# Patient Record
Sex: Male | Born: 2000 | Race: White | Hispanic: No | Marital: Single | Smoking: Never smoker
Health system: Southern US, Community
[De-identification: ages and names within clinical notes are randomized; demographics above are authoritative.]

## PROBLEM LIST (undated history)

## (undated) DIAGNOSIS — M1990 Unspecified osteoarthritis, unspecified site: Secondary | ICD-10-CM

## (undated) DIAGNOSIS — M249 Joint derangement, unspecified: Secondary | ICD-10-CM

---
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
## 2021-06-10 ENCOUNTER — Emergency Department (HOSPITAL_COMMUNITY): Payer: BC Managed Care – PPO

## 2021-06-10 ENCOUNTER — Other Ambulatory Visit: Payer: Self-pay

## 2021-06-10 ENCOUNTER — Emergency Department (HOSPITAL_COMMUNITY)
Admission: EM | Admit: 2021-06-10 | Discharge: 2021-06-10 | Disposition: A | Discharge status: Home / Self Care | Payer: BC Managed Care – PPO | Attending: Emergency Medicine | Admitting: Emergency Medicine

## 2021-06-10 ENCOUNTER — Encounter (HOSPITAL_COMMUNITY): Payer: Self-pay

## 2021-06-10 DIAGNOSIS — J982 Interstitial emphysema: Secondary | ICD-10-CM | POA: Diagnosis not present

## 2021-06-10 DIAGNOSIS — R0789 Other chest pain: Secondary | ICD-10-CM | POA: Diagnosis present

## 2021-06-10 LAB — CBC WITH DIFFERENTIAL/PLATELET
Abs Immature Granulocytes: 0.01 10*3/uL (ref 0.00–0.07)
Basophils Absolute: 0 10*3/uL (ref 0.0–0.1)
Basophils Relative: 0 %
Eosinophils Absolute: 0.1 10*3/uL (ref 0.0–0.5)
Eosinophils Relative: 1 %
HCT: 43.9 % (ref 39.0–52.0)
Hemoglobin: 14.9 g/dL (ref 13.0–17.0)
Immature Granulocytes: 0 %
Lymphocytes Relative: 31 %
Lymphs Abs: 2.6 10*3/uL (ref 0.7–4.0)
MCH: 30.4 pg (ref 26.0–34.0)
MCHC: 33.9 g/dL (ref 30.0–36.0)
MCV: 89.6 fL (ref 80.0–100.0)
Monocytes Absolute: 0.9 10*3/uL (ref 0.1–1.0)
Monocytes Relative: 11 %
Neutro Abs: 4.8 10*3/uL (ref 1.7–7.7)
Neutrophils Relative %: 57 %
Platelets: 235 10*3/uL (ref 150–400)
RBC: 4.9 MIL/uL (ref 4.22–5.81)
RDW: 13.3 % (ref 11.5–15.5)
WBC: 8.4 10*3/uL (ref 4.0–10.5)
nRBC: 0 % (ref 0.0–0.2)

## 2021-06-10 LAB — BASIC METABOLIC PANEL
Anion gap: 5 (ref 5–15)
BUN: 18 mg/dL (ref 6–20)
CO2: 24 mmol/L (ref 22–32)
Calcium: 9.3 mg/dL (ref 8.9–10.3)
Chloride: 106 mmol/L (ref 98–111)
Creatinine, Ser: 0.9 mg/dL (ref 0.61–1.24)
GFR, Estimated: >60 mL/min (ref >60)
Glucose, Bld: 95 mg/dL (ref 70–99)
Potassium: 3.7 mmol/L (ref 3.5–5.1)
Sodium: 135 mmol/L (ref 135–145)

## 2021-06-10 LAB — TROPONIN I (HIGH SENSITIVITY): Troponin I (High Sensitivity): <2 ng/L (ref <18)

## 2021-06-10 MED ORDER — IOHEXOL 350 MG/ML SOLN
100.0000 mL | Freq: Once | INTRAVENOUS | Status: AC | PRN
  Administered 2021-06-10: 100 mL via INTRAVENOUS

## 2021-06-10 NOTE — ED Notes (Signed)
Called lab to process add-on troponin.

## 2021-06-10 NOTE — ED Notes (Signed)
Pt in bed, sig other at bedside, pt states that he was sent from urgent care for some air in his chest, pt has slight crepitus like feeling on palpation of L upper chest mid clavicular line.  PT reports pain to ribs with palpation,  pt on cardiac and O2 sat monitor, skin is warm and dry, resps even and unlabored, pt states he doesn't need anything for pain at this time.
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
## 2021-06-10 NOTE — ED Notes (Signed)
Blue top sent down as save if needed.

## 2021-06-10 NOTE — Discharge Instructions (Addendum)
Please carefully review the attached instructions, paying attention to return precautions to the ER.

## 2021-06-10 NOTE — ED Notes (Signed)
Pt in bed, pt states that his pain is a 5/10, states that he doesn't need anything for pain, pt states that he is ready to go home, resps even and unlabored, pt from dpt with sig other.
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
## 2021-06-23 ENCOUNTER — Ambulatory Visit
Admission: RE | Admit: 2021-06-23 | Discharge: 2021-06-23 | Disposition: A | Discharge status: Home / Self Care | Payer: BC Managed Care – PPO | Source: Ambulatory Visit | Attending: Physician Assistant | Admitting: Physician Assistant

## 2021-06-23 ENCOUNTER — Other Ambulatory Visit: Payer: Self-pay

## 2021-06-23 ENCOUNTER — Institutional Professional Consult (permissible substitution) (INDEPENDENT_AMBULATORY_CARE_PROVIDER_SITE_OTHER): Payer: BC Managed Care – PPO | Admitting: Physician Assistant

## 2021-06-23 VITALS — BP 144/89 | HR 97 | Resp 20 | Ht 72.0 in | Wt 135.0 lb

## 2021-06-23 DIAGNOSIS — J982 Interstitial emphysema: Secondary | ICD-10-CM | POA: Diagnosis not present
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
## 2021-06-23 NOTE — Patient Instructions (Addendum)
We recommend no heavy lifting or heavy exercise, no scuba diving, no flying and avoid tobacco products (vaping) for a minimum of 4 weeks from the date of injury.

## 2021-07-22 ENCOUNTER — Ambulatory Visit
Admission: RE | Admit: 2021-07-22 | Discharge: 2021-07-22 | Disposition: A | Discharge status: Home / Self Care | Payer: BC Managed Care – PPO | Source: Ambulatory Visit | Attending: Physician Assistant | Admitting: Physician Assistant

## 2021-07-22 ENCOUNTER — Telehealth: Payer: Self-pay | Admitting: *Deleted

## 2021-07-22 ENCOUNTER — Other Ambulatory Visit: Payer: Self-pay | Admitting: *Deleted

## 2021-07-22 DIAGNOSIS — J982 Interstitial emphysema: Secondary | ICD-10-CM

## 2021-07-22 NOTE — Telephone Encounter (Signed)
Patient contacted the office with concerns of another pneumomediastinum. Per patient, he was lifting approximately 20lb equipment at work consistently for about 30 minutes. Per patient felt a heaviness in his chest afterwards similar to what he felt when he had a pneumomediastinum. He states he feels slightly out of breath. Chest xray ordered and reviewed with Gaynelle Arabian, PA. Patient aware of results. Advised patient to follow up with PCP for further symptom management.

## 2022-01-30 ENCOUNTER — Ambulatory Visit
Admission: EM | Admit: 2022-01-30 | Discharge: 2022-01-30 | Disposition: A | Discharge status: Home / Self Care | Payer: BC Managed Care – PPO

## 2022-01-30 DIAGNOSIS — S76111A Strain of right quadriceps muscle, fascia and tendon, initial encounter: Secondary | ICD-10-CM | POA: Diagnosis not present
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
## 2022-01-30 NOTE — Discharge Instructions (Addendum)
I enclosed some information about quadricep strain and self-care at home.  I provided you with a note for this weekend.  Please follow-up with your regular provider if you are not seeing meaningful improvement in the next 7 to 10 days.

## 2022-02-09 ENCOUNTER — Ambulatory Visit: Payer: BC Managed Care – PPO

## 2022-02-09 ENCOUNTER — Ambulatory Visit
Admission: EM | Admit: 2022-02-09 | Discharge: 2022-02-09 | Disposition: A | Discharge status: Home / Self Care | Payer: BC Managed Care – PPO

## 2022-02-09 NOTE — ED Triage Notes (Signed)
Triaged and discharged by provider

## 2022-04-25 ENCOUNTER — Ambulatory Visit
Admission: EM | Admit: 2022-04-25 | Discharge: 2022-04-25 | Disposition: A | Discharge status: Home / Self Care | Payer: BC Managed Care – PPO

## 2022-04-25 DIAGNOSIS — R112 Nausea with vomiting, unspecified: Secondary | ICD-10-CM | POA: Diagnosis not present

## 2022-04-25 DIAGNOSIS — K529 Noninfective gastroenteritis and colitis, unspecified: Secondary | ICD-10-CM | POA: Diagnosis not present

## 2022-04-25 MED ORDER — ONDANSETRON 4 MG PO TBDP: 4.0000 mg | ORAL_TABLET | Freq: Three times a day (TID) | ORAL | 0 refills | Status: DC | PRN

## 2022-04-25 MED ORDER — ONDANSETRON 4 MG PO TBDP
4.0000 mg | ORAL_TABLET | Freq: Once | ORAL | Status: AC
  Administered 2022-04-25: 4 mg via ORAL

## 2022-04-25 NOTE — ED Triage Notes (Signed)
Pt reports nausea and fever x 1 day. Tylenol gives some relief.   Negative COVID last night.

## 2022-04-25 NOTE — ED Provider Notes (Signed)
[facility]    CSN: [id number] Arrival date & time: 04/25/22  7106    HISTORY   Chief Complaint  Patient presents with   Nausea    Entered by patient   Fever   HPI Anthony Montgomery is a pleasant, 21 y.o. male who presents to urgent care today. Patient reports fever of 100 last night and fever of 99.9 this morning.  Patient states he has had nausea and vomiting since yesterday evening.  Patient states he supposed to work tonight but does not feel well enough to go, is requesting a note for work.  Patient denies illicit drug abuse, recently eating foods that may be contaminated, known sick contacts.  Patient denies sore throat, headache, cough, rhinorrhea, nasal congestion, otalgia, body aches, chills, diarrhea, constipation.  Patient states he took Tylenol for his fever this morning, did not vomit after doing so.  Patient states he is tolerating p.o. liquids well.  Patient has normal vital signs on arrival today.  The history is provided by the patient.   Past Medical History:  Diagnosis Date   Arthritis    Hypermobile joints    There are no problems to display for this patient.  History reviewed. No pertinent surgical history.  Home Medications    Prior to Admission medications   Medication Sig Start Date End Date Taking? Authorizing Provider  acetaminophen (TYLENOL) 500 MG tablet Take 500 mg by mouth every 6 (six) hours as needed.   Yes [provider]  esomeprazole (NEXIUM) 20 MG capsule esomeprazole 20 mg oral delayed release capsule Start Date: 07/16/20 Status: Ordered 07/16/20  Yes [provider]  ondansetron (ZOFRAN-ODT) 4 MG disintegrating tablet Take 1 tablet (4 mg total) by mouth every 8 (eight) hours as needed for nausea or vomiting. 04/25/22  Yes Theadora Rama Scales, PA-C  sertraline (ZOLOFT) 100 MG tablet Take 100 mg by mouth daily.    [provider]    Family History Family History  Problem Relation Age of Onset    Healthy Mother    Healthy Father    Social History Social History   Tobacco Use   Smoking status: Never   Smokeless tobacco: Never  Vaping Use   Vaping Use: Never used  Substance Use Topics   Alcohol use: Yes    Comment: ocassionally   Drug use: Never   Allergies   Patient has no known allergies.  Review of Systems Review of Systems Pertinent findings revealed after performing a 14 point review of systems has been noted in the history of present illness.  Physical Exam Triage Vital Signs ED Triage Vitals  Enc Vitals Group     BP 03/21/21 0827 (!) 147/82     Pulse Rate 03/21/21 0827 72     Resp 03/21/21 0827 18     Temp 03/21/21 0827 98.3 F (36.8 C)     Temp Source 03/21/21 0827 Oral     SpO2 03/21/21 0827 98 %     Weight --      Height --      Head Circumference --      Peak Flow --      Pain Score 03/21/21 0826 5     Pain Loc --      Pain Edu? --      Excl. in GC? --   No data found.  Updated Vital Signs BP 127/71 (BP Location: Right Arm)   Pulse (!) 54   Temp 97.6 F (36.4 C) (Oral)  Resp 17   SpO2 98%   Physical Exam Vitals and nursing note reviewed.  Constitutional:      General: He is not in acute distress.    Appearance: Normal appearance. He is normal weight. He is not ill-appearing.  HENT:     Head: Normocephalic and atraumatic.  Eyes:     Extraocular Movements: Extraocular movements intact.     Conjunctiva/sclera: Conjunctivae normal.     Pupils: Pupils are equal, round, and reactive to light.  Cardiovascular:     Rate and Rhythm: Normal rate and regular rhythm.  Pulmonary:     Effort: Pulmonary effort is normal.     Breath sounds: Normal breath sounds.  Abdominal:     General: Abdomen is flat. Bowel sounds are normal.     Palpations: Abdomen is soft.  Musculoskeletal:        General: Normal range of motion.     Cervical back: Normal range of motion and neck supple.  Skin:    General: Skin is warm and dry.  Neurological:      General: No focal deficit present.     Mental Status: He is alert and oriented to person, place, and time. Mental status is at baseline.  Psychiatric:        Mood and Affect: Mood normal.        Behavior: Behavior normal.        Thought Content: Thought content normal.        Judgment: Judgment normal.     Visual Acuity Right Eye Distance:   Left Eye Distance:   Bilateral Distance:    Right Eye Near:   Left Eye Near:    Bilateral Near:     UC Couse / Diagnostics / Procedures:     Radiology No results found.  Procedures Procedures (including critical care time) EKG  Pending results:  Labs Reviewed - No data to display  Medications Ordered in UC: Medications  ondansetron (ZOFRAN-ODT) disintegrating tablet 4 mg (has no administration in time range)    UC Diagnoses / Final Clinical Impressions(s)   I have reviewed the triage vital signs and the nursing notes.  Pertinent labs & imaging results that were available during my care of the patient were reviewed by me and considered in my medical decision making (see chart for details).    Final diagnoses:  Nausea and vomiting, unspecified vomiting type  Gastroenteritis   Patient provided with a note to be out of work along with a prescription for Zofran.  Zofran provided during his visit today as well.  Return precautions advised.  ED Prescriptions     Medication Sig Dispense Auth. Provider   ondansetron (ZOFRAN-ODT) 4 MG disintegrating tablet Take 1 tablet (4 mg total) by mouth every 8 (eight) hours as needed for nausea or vomiting. 20 tablet Theadora Rama Scales, PA-C      PDMP not reviewed this encounter.  Pending results:  Labs Reviewed - No data to display  Discharge Instructions:   Discharge Instructions      I provided you with a note to return to work.  I sent a prescription for Zofran to your pharmacy, this is a same medication that you received during your visit today.  This medication reduces  nausea symptoms therefore resolves vomiting.  I provided you with some information about nausea and vomiting and self-care at home.  Please let us know if your symptoms do not resolve after 48 to 72 hours.  Thank you for visiting urgent  care today.    Disposition Upon Discharge:  Condition: stable for discharge home  Patient presented with an acute illness with associated systemic symptoms and significant discomfort requiring urgent management. In my opinion, this is a condition that a prudent lay person (someone who possesses an average knowledge of health and medicine) may potentially expect to result in complications if not addressed urgently such as respiratory distress, impairment of bodily function or dysfunction of bodily organs.   Routine symptom specific, illness specific and/or disease specific instructions were discussed with the patient and/or caregiver at length.   As such, the patient has been evaluated and assessed, work-up was performed and treatment was provided in alignment with urgent care protocols and evidence based medicine.  Patient/parent/caregiver has been advised that the patient may require follow up for further testing and treatment if the symptoms continue in spite of treatment, as clinically indicated and appropriate.  Patient/parent/caregiver has been advised to return to the [facility] or PCP if no better; to PCP or the Emergency Department if new signs and symptoms develop, or if the current signs or symptoms continue to change or worsen for further workup, evaluation and treatment as clinically indicated and appropriate  The patient will follow up with their current PCP if and as advised. If the patient does not currently have a PCP we will assist them in obtaining one.   The patient may need specialty follow up if the symptoms continue, in spite of conservative treatment and management, for further workup, evaluation, consultation and treatment as clinically indicated  and appropriate.   Patient/parent/caregiver verbalized understanding and agreement of plan as discussed.  All questions were addressed during visit.  Please see discharge instructions below for further details of plan.  This office note has been dictated using Teaching laboratory technician.  Unfortunately, this method of dictation can sometimes lead to typographical or grammatical errors.  I apologize for your inconvenience in advance if this occurs.  Please do not hesitate to reach out to me if clarification is needed.      Theadora Rama Scales, PA-C 04/25/22 1145

## 2022-04-25 NOTE — Discharge Instructions (Signed)
I provided you with a note to return to work.  I sent a prescription for Zofran to your pharmacy, this is a same medication that you received during your visit today.  This medication reduces nausea symptoms therefore resolves vomiting.  I provided you with some information about nausea and vomiting and self-care at home.  Please let us know if your symptoms do not resolve after 48 to 72 hours.  Thank you for visiting urgent care today.

## 2022-09-27 ENCOUNTER — Ambulatory Visit (INDEPENDENT_AMBULATORY_CARE_PROVIDER_SITE_OTHER): Payer: BC Managed Care – PPO

## 2022-09-27 ENCOUNTER — Encounter (HOSPITAL_COMMUNITY): Payer: Self-pay

## 2022-09-27 ENCOUNTER — Ambulatory Visit (HOSPITAL_COMMUNITY)
Admission: EM | Admit: 2022-09-27 | Discharge: 2022-09-27 | Disposition: A | Discharge status: Home / Self Care | Payer: BC Managed Care – PPO | Attending: Family Medicine | Admitting: Family Medicine

## 2022-09-27 DIAGNOSIS — M25511 Pain in right shoulder: Secondary | ICD-10-CM

## 2022-09-27 MED ORDER — IBUPROFEN 800 MG PO TABS: 800.0000 mg | ORAL_TABLET | Freq: Three times a day (TID) | ORAL | 0 refills | Status: DC | PRN
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
## 2022-09-27 NOTE — Discharge Instructions (Signed)
Here for your x-rays do not show any dislocation at this time.  There is also no broken bone.  I do think you have some loosening of your muscles around your shoulder.  I think it would be helpful to go see orthopedics about this   Take ibuprofen 800 mg--1 tab every 8 hours as needed for pain.

## 2022-11-10 IMAGING — CR DG CHEST 2V
2 series · 2 of 2 positions shown · non-contrast
Comparison: 06/23/2021

CLINICAL DATA: History of pneumomediastinum.

EXAM:
CHEST - 2 VIEW

[w chest pa]
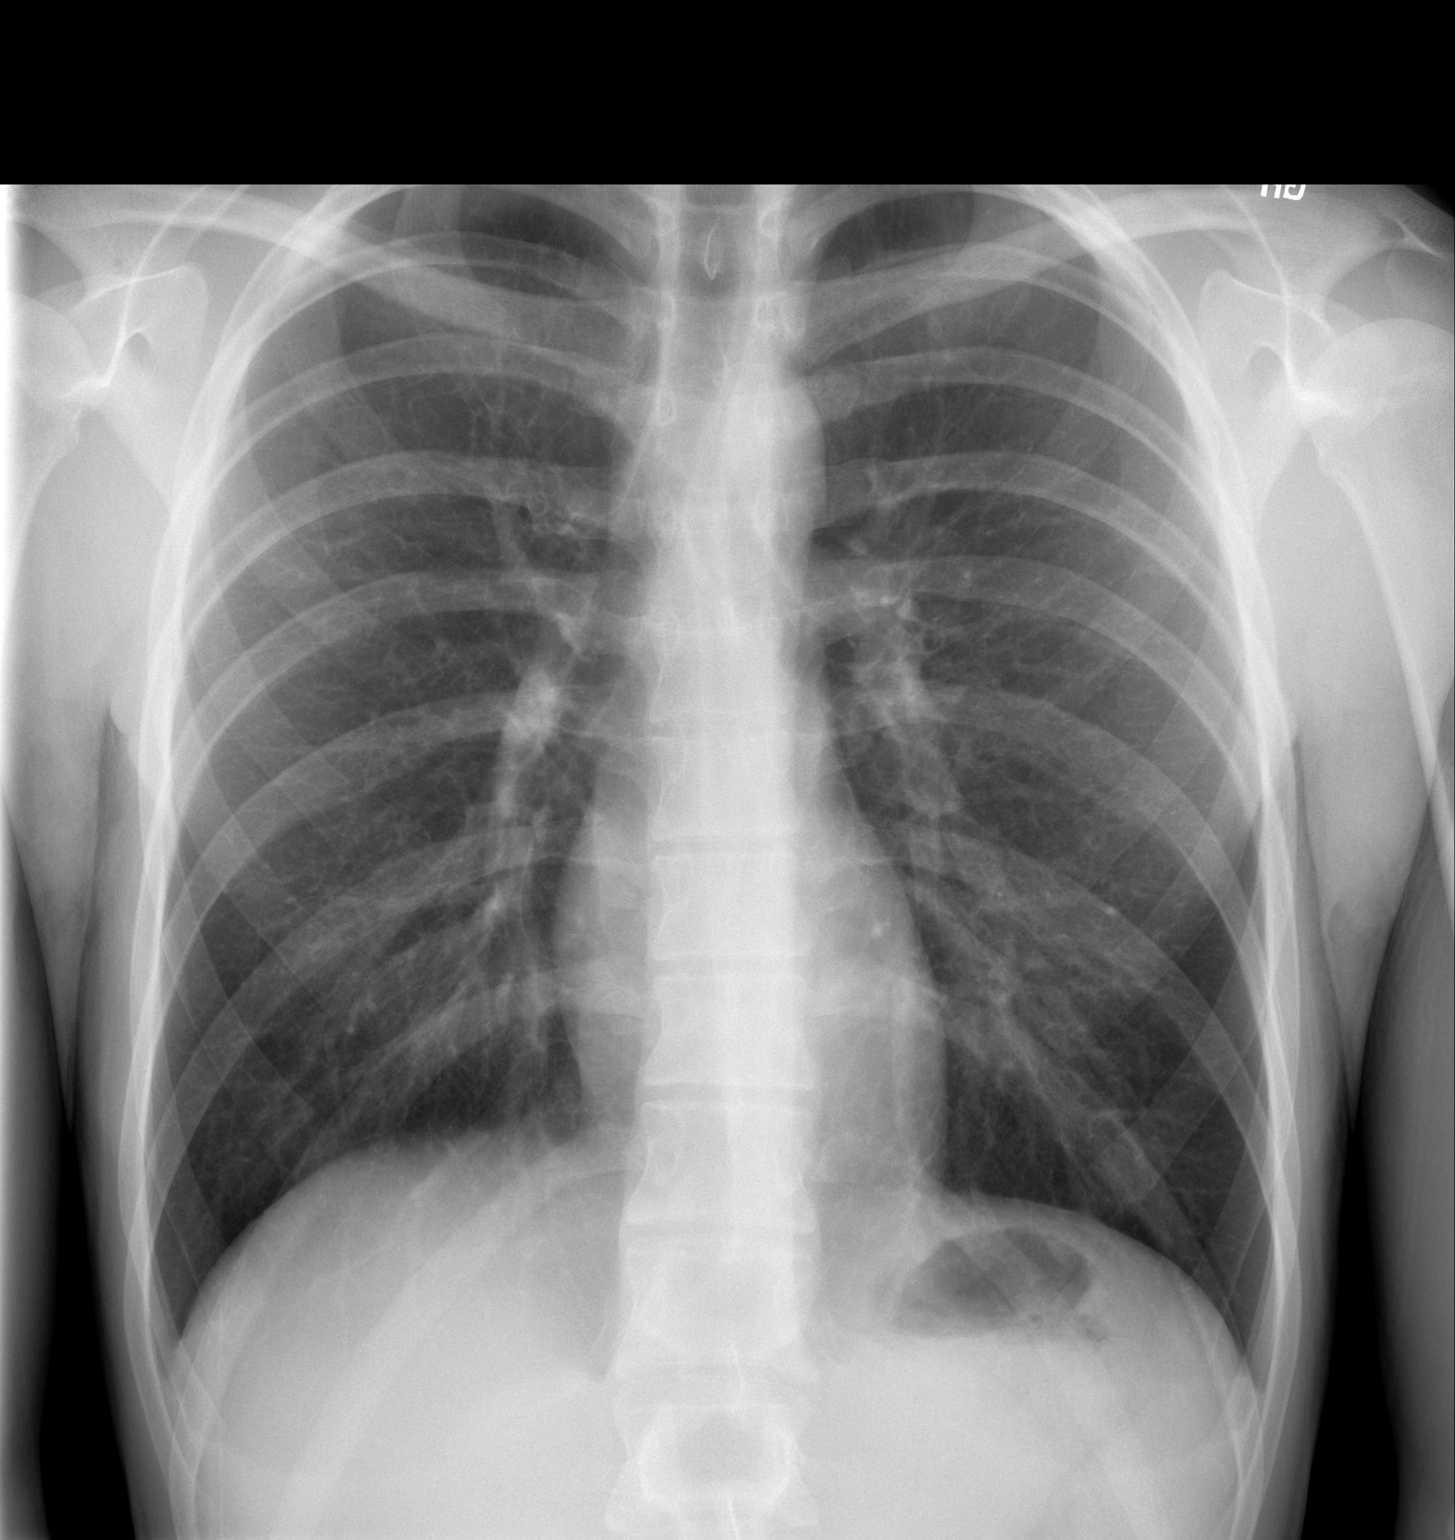

[w chest lat]
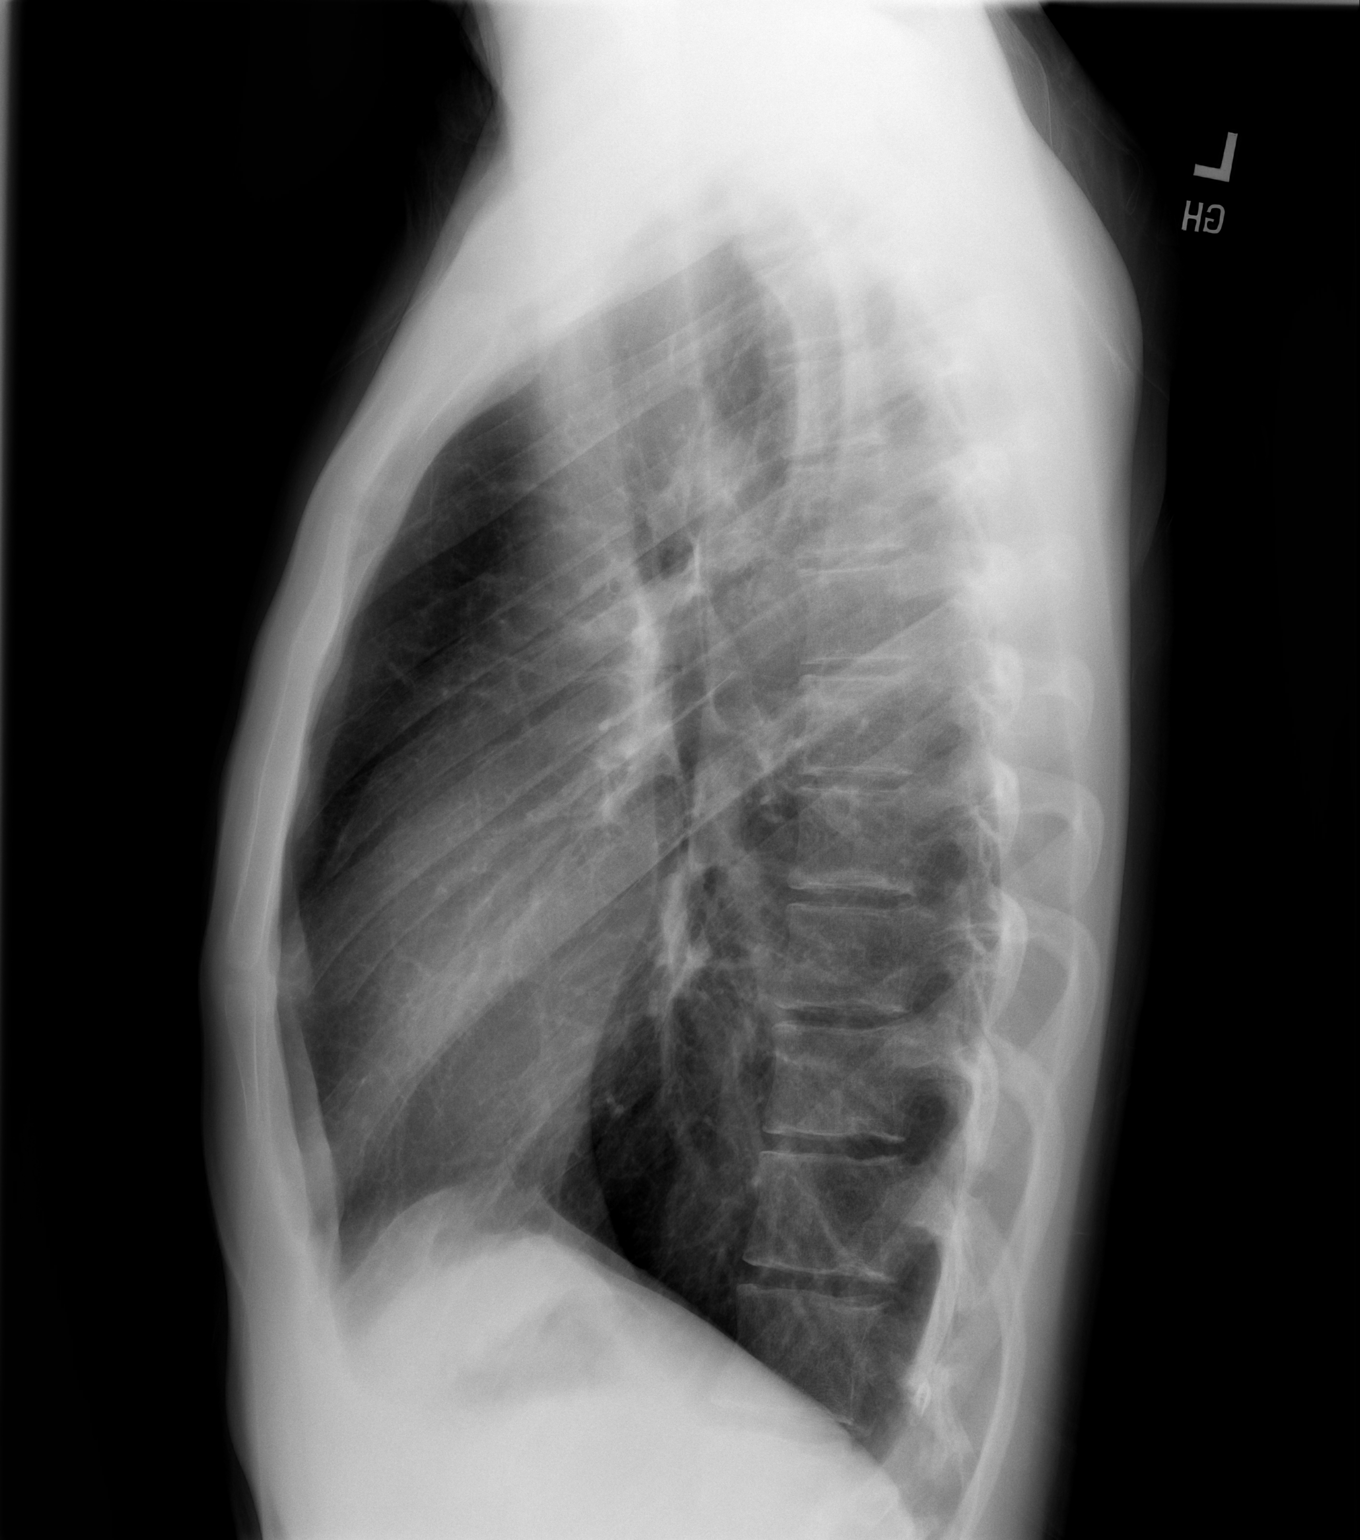

[2 of 2 positions shown; findings below may reference images not displayed]

FINDINGS: The heart size and mediastinal contours are within normal limits.
Both lungs are clear. The visualized skeletal structures are
unremarkable.
IMPRESSION: No active cardiopulmonary disease.

## 2023-03-06 ENCOUNTER — Emergency Department (HOSPITAL_COMMUNITY)
Admission: EM | Admit: 2023-03-06 | Discharge: 2023-03-06 | Disposition: A | Discharge status: Home / Self Care | Payer: BC Managed Care – PPO | Attending: Emergency Medicine | Admitting: Emergency Medicine

## 2023-03-06 ENCOUNTER — Emergency Department (HOSPITAL_COMMUNITY): Payer: BC Managed Care – PPO

## 2023-03-06 DIAGNOSIS — Y92410 Unspecified street and highway as the place of occurrence of the external cause: Secondary | ICD-10-CM | POA: Diagnosis not present

## 2023-03-06 DIAGNOSIS — S62396A Other fracture of fifth metacarpal bone, right hand, initial encounter for closed fracture: Secondary | ICD-10-CM | POA: Diagnosis not present

## 2023-03-06 DIAGNOSIS — S0993XA Unspecified injury of face, initial encounter: Secondary | ICD-10-CM | POA: Diagnosis present

## 2023-03-06 DIAGNOSIS — S42002A Fracture of unspecified part of left clavicle, initial encounter for closed fracture: Secondary | ICD-10-CM | POA: Diagnosis not present

## 2023-03-06 DIAGNOSIS — S81811A Laceration without foreign body, right lower leg, initial encounter: Secondary | ICD-10-CM | POA: Insufficient documentation

## 2023-03-06 DIAGNOSIS — Z23 Encounter for immunization: Secondary | ICD-10-CM | POA: Diagnosis not present

## 2023-03-06 DIAGNOSIS — S022XXA Fracture of nasal bones, initial encounter for closed fracture: Secondary | ICD-10-CM | POA: Insufficient documentation

## 2023-03-06 LAB — COMPREHENSIVE METABOLIC PANEL
ALT: 23 U/L (ref 0–44)
AST: 45 U/L — ABNORMAL HIGH (ref 15–41)
Albumin: 4.1 g/dL (ref 3.5–5.0)
Alkaline Phosphatase: 72 U/L (ref 38–126)
Anion gap: 17 — ABNORMAL HIGH (ref 5–15)
BUN: 17 mg/dL (ref 6–20)
CO2: 19 mmol/L — ABNORMAL LOW (ref 22–32)
Calcium: 9.6 mg/dL (ref 8.9–10.3)
Chloride: 102 mmol/L (ref 98–111)
Creatinine, Ser: 1.13 mg/dL (ref 0.61–1.24)
GFR, Estimated: >60 mL/min (ref >60)
Glucose, Bld: 144 mg/dL — ABNORMAL HIGH (ref 70–99)
Potassium: 3.7 mmol/L (ref 3.5–5.1)
Sodium: 138 mmol/L (ref 135–145)
Total Bilirubin: 1.2 mg/dL (ref 0.3–1.2)
Total Protein: 7.4 g/dL (ref 6.5–8.1)

## 2023-03-06 LAB — CBC
HCT: 41.6 % (ref 39.0–52.0)
Hemoglobin: 14.3 g/dL (ref 13.0–17.0)
MCH: 29.2 pg (ref 26.0–34.0)
MCHC: 34.4 g/dL (ref 30.0–36.0)
MCV: 84.9 fL (ref 80.0–100.0)
Platelets: 340 10*3/uL (ref 150–400)
RBC: 4.9 MIL/uL (ref 4.22–5.81)
RDW: 12.8 % (ref 11.5–15.5)
WBC: 16.6 10*3/uL — ABNORMAL HIGH (ref 4.0–10.5)
nRBC: 0 % (ref 0.0–0.2)

## 2023-03-06 LAB — URINALYSIS, ROUTINE W REFLEX MICROSCOPIC
Bacteria, UA: NONE SEEN
Bilirubin Urine: NEGATIVE
Glucose, UA: NEGATIVE mg/dL
Ketones, ur: 20 mg/dL — AB
Leukocytes,Ua: NEGATIVE
Nitrite: NEGATIVE
Protein, ur: 30 mg/dL — AB
Specific Gravity, Urine: >1.046 — ABNORMAL HIGH (ref 1.005–1.030)
pH: 6 (ref 5.0–8.0)

## 2023-03-06 LAB — SAMPLE TO BLOOD BANK

## 2023-03-06 LAB — I-STAT CHEM 8, ED
BUN: 18 mg/dL (ref 6–20)
Calcium, Ion: 1.15 mmol/L (ref 1.15–1.40)
Chloride: 105 mmol/L (ref 98–111)
Creatinine, Ser: 0.9 mg/dL (ref 0.61–1.24)
Glucose, Bld: 143 mg/dL — ABNORMAL HIGH (ref 70–99)
HCT: 44 % (ref 39.0–52.0)
Hemoglobin: 15 g/dL (ref 13.0–17.0)
Potassium: 3.3 mmol/L — ABNORMAL LOW (ref 3.5–5.1)
Sodium: 138 mmol/L (ref 135–145)
TCO2: 20 mmol/L — ABNORMAL LOW (ref 22–32)

## 2023-03-06 LAB — I-STAT CG4 LACTIC ACID, ED: Lactic Acid, Venous: 3.2 mmol/L (ref 0.5–1.9)

## 2023-03-06 LAB — PROTIME-INR
INR: 1.1 (ref 0.8–1.2)
Prothrombin Time: 14.4 s (ref 11.4–15.2)

## 2023-03-06 LAB — ETHANOL: Alcohol, Ethyl (B): <10 mg/dL (ref <10)

## 2023-03-06 MED ORDER — TETANUS-DIPHTH-ACELL PERTUSSIS 5-2.5-18.5 LF-MCG/0.5 IM SUSY
0.5000 mL | PREFILLED_SYRINGE | Freq: Once | INTRAMUSCULAR | Status: AC
  Administered 2023-03-06: 0.5 mL via INTRAMUSCULAR
  Filled 2023-03-06: qty 0.5

## 2023-03-06 MED ORDER — FENTANYL CITRATE PF 50 MCG/ML IJ SOSY
50.0000 ug | PREFILLED_SYRINGE | Freq: Once | INTRAMUSCULAR | Status: AC
  Administered 2023-03-06: 50 ug via INTRAVENOUS
  Filled 2023-03-06: qty 1

## 2023-03-06 MED ORDER — OXYCODONE-ACETAMINOPHEN 5-325 MG PO TABS
2.0000 | ORAL_TABLET | Freq: Once | ORAL | Status: AC
  Administered 2023-03-06: 2 via ORAL
  Filled 2023-03-06: qty 2

## 2023-03-06 MED ORDER — ONDANSETRON HCL 4 MG/2ML IJ SOLN
4.0000 mg | Freq: Once | INTRAMUSCULAR | Status: AC
  Administered 2023-03-06: 4 mg via INTRAVENOUS
  Filled 2023-03-06: qty 2

## 2023-03-06 MED ORDER — LIDOCAINE-EPINEPHRINE 1 %-1:100000 IJ SOLN
10.0000 mL | Freq: Once | INTRAMUSCULAR | Status: AC
  Administered 2023-03-06: 5 mL
  Filled 2023-03-06: qty 1

## 2023-03-06 MED ORDER — OXYCODONE HCL 5 MG PO TABS
5.0000 mg | ORAL_TABLET | ORAL | 0 refills | Status: AC | PRN
Start: 2023-03-06 — End: ?

## 2023-03-06 MED ORDER — IOHEXOL 350 MG/ML SOLN
75.0000 mL | Freq: Once | INTRAVENOUS | Status: AC | PRN
  Administered 2023-03-06: 75 mL via INTRAVENOUS

## 2023-03-06 NOTE — Progress Notes (Signed)
Orthopedic Tech Progress Note Patient Details:  Anthony Montgomery 2001-05-06 [id number]  Ortho Devices Type of Ortho Device: Sling immobilizer, CAM walker, Ulna gutter splint Ortho Device/Splint Location: lle cam boot, rue ulna gutter, lue sling immobilizer. Ortho Device/Splint Interventions: Ordered, Application, Adjustment  I applied the splint and boot. The rn applied the sling. Post Interventions Patient Tolerated: Well Instructions Provided: Care of device, Adjustment of device  [name] 03/06/2023, 10:21 PM
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
## 2023-03-06 NOTE — ED Notes (Signed)
All of patients wounds cleaned and dressed. Extra supplies sent with patient. Sling applied to left arm after dressing patient.
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
## 2023-03-07 ENCOUNTER — Telehealth (HOSPITAL_COMMUNITY): Payer: Self-pay | Admitting: Emergency Medicine

## 2023-03-07 ENCOUNTER — Telehealth: Payer: Self-pay

## 2023-03-07 MED ORDER — ONDANSETRON HCL 4 MG PO TABS: 4.0000 mg | ORAL_TABLET | Freq: Four times a day (QID) | ORAL | 0 refills | Status: AC
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
## 2023-03-07 NOTE — Telephone Encounter (Signed)
Patient called in about referrals, no information on chart about who he is supposed to follow up with, ambulatory referrals were sent to orthopedic, ENT and hand surgery. Sent patient a message that they will call him when they wick up the referrals.

## 2023-03-09 ENCOUNTER — Other Ambulatory Visit (INDEPENDENT_AMBULATORY_CARE_PROVIDER_SITE_OTHER): Payer: Self-pay

## 2023-03-09 ENCOUNTER — Other Ambulatory Visit (INDEPENDENT_AMBULATORY_CARE_PROVIDER_SITE_OTHER): Payer: BC Managed Care – PPO

## 2023-03-09 ENCOUNTER — Ambulatory Visit: Payer: BC Managed Care – PPO | Admitting: Orthopaedic Surgery

## 2023-03-09 DIAGNOSIS — M79672 Pain in left foot: Secondary | ICD-10-CM | POA: Diagnosis not present

## 2023-03-09 DIAGNOSIS — M79641 Pain in right hand: Secondary | ICD-10-CM

## 2023-03-09 DIAGNOSIS — S4992XA Unspecified injury of left shoulder and upper arm, initial encounter: Secondary | ICD-10-CM | POA: Diagnosis not present

## 2023-03-09 MED ORDER — TRAMADOL HCL 50 MG PO TABS
50.0000 mg | ORAL_TABLET | Freq: Three times a day (TID) | ORAL | 0 refills | Status: AC | PRN
Start: 2023-03-09 — End: ?
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
## 2023-03-10 ENCOUNTER — Ambulatory Visit (INDEPENDENT_AMBULATORY_CARE_PROVIDER_SITE_OTHER): Payer: BC Managed Care – PPO | Admitting: Otolaryngology

## 2023-03-10 ENCOUNTER — Encounter (INDEPENDENT_AMBULATORY_CARE_PROVIDER_SITE_OTHER): Payer: Self-pay

## 2023-03-10 VITALS — Ht 72.0 in | Wt 151.0 lb

## 2023-03-10 DIAGNOSIS — S022XXA Fracture of nasal bones, initial encounter for closed fracture: Secondary | ICD-10-CM

## 2023-03-11 ENCOUNTER — Telehealth: Payer: Self-pay | Admitting: Physician Assistant

## 2023-03-11 NOTE — Telephone Encounter (Signed)
6 months

## 2023-03-11 NOTE — Telephone Encounter (Signed)
Left voicemail ready for pickup

## 2023-03-11 NOTE — Telephone Encounter (Signed)
Pt's girlfriend called requesting a temp handicap placard. Please call Delorise Shiner when ready for pick up at [phone].

## 2023-03-12 DIAGNOSIS — S022XXA Fracture of nasal bones, initial encounter for closed fracture: Secondary | ICD-10-CM | POA: Insufficient documentation
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
## 2023-03-15 ENCOUNTER — Telehealth: Payer: Self-pay | Admitting: Orthopaedic Surgery

## 2023-03-15 NOTE — Telephone Encounter (Signed)
STD forms ,auth,$25 cash received. To [employer]. Please advised how long patient will be oow. Thank you

## 2023-03-15 NOTE — Telephone Encounter (Signed)
Noted for [employer]. 

## 2023-03-15 NOTE — Telephone Encounter (Signed)
6-12 weeks 

## 2023-03-23 ENCOUNTER — Ambulatory Visit: Payer: BC Managed Care – PPO | Admitting: Physician Assistant

## 2023-03-30 ENCOUNTER — Ambulatory Visit (INDEPENDENT_AMBULATORY_CARE_PROVIDER_SITE_OTHER): Payer: BC Managed Care – PPO | Admitting: Physician Assistant

## 2023-03-30 ENCOUNTER — Encounter: Payer: Self-pay | Admitting: Physician Assistant

## 2023-03-30 ENCOUNTER — Other Ambulatory Visit (INDEPENDENT_AMBULATORY_CARE_PROVIDER_SITE_OTHER): Payer: BC Managed Care – PPO

## 2023-03-30 ENCOUNTER — Other Ambulatory Visit (INDEPENDENT_AMBULATORY_CARE_PROVIDER_SITE_OTHER): Payer: Self-pay

## 2023-03-30 DIAGNOSIS — S4992XA Unspecified injury of left shoulder and upper arm, initial encounter: Secondary | ICD-10-CM

## 2023-03-30 DIAGNOSIS — M79672 Pain in left foot: Secondary | ICD-10-CM

## 2023-03-30 DIAGNOSIS — M79641 Pain in right hand: Secondary | ICD-10-CM

## 2023-03-30 MED ORDER — TRAMADOL HCL 50 MG PO TABS
50.0000 mg | ORAL_TABLET | Freq: Three times a day (TID) | ORAL | 2 refills | Status: AC | PRN
Start: 2023-03-30 — End: ?

## 2023-03-30 NOTE — Progress Notes (Signed)
Office Visit Note   Patient: Anthony Montgomery           Date of Birth: May 09, 2001           MRN: [id number] Visit Date: 03/30/2023              Requested by: No referring provider defined for this encounter. PCP: Pcp, No   Assessment & Plan: Visit Diagnoses:  1. Pain in right hand   2. Pain in left foot   3. Injury of left clavicle, initial encounter     Plan: Impression is 3 and half weeks status post right fifth metacarpal fracture, left midshaft clavicle fracture, left talus fracture and right knee and ankle superficial abrasions.  He is clinically and radiographically improving.  In regards to the fifth metacarpal fracture, he will continue wearing his Velcro splint for another 2 weeks.  He may come out of this at home to work on range of motion as tolerated.  In regards to the left clavicle, we will allow him to come out of his sling at home.  He will continue to wear this in public for another few weeks.  He will work on range of motion as tolerated.  In regards to his left foot talus fracture, he may wean out of his boot as tolerated.  He will continue with Xeroform and dry bandages to the superficial abrasions until healed.  He will follow-up with all 3 of these fractures in 4 weeks with repeat evaluation and x-rays of the right hand, left clavicle and left foot.  Follow-Up Instructions: Return in about 4 weeks (around 04/27/2023).   Orders:  Orders Placed This Encounter  Procedures   XR Clavicle Left   XR Foot Complete Left   XR Hand Complete Right   Meds ordered this encounter  Medications   traMADol (ULTRAM) 50 MG tablet    Sig: Take 1 tablet (50 mg total) by mouth 3 (three) times daily as needed.    Dispense:  30 tablet    Refill:  2      Procedures: No procedures performed   Clinical Data: No additional findings.   Subjective: Chief Complaint  Patient presents with   Left Shoulder - Follow-up    Clavicle fracture   Left Foot - Follow-up    Talus  fracture   Right Hand - Follow-up    5th metacarpal fracture    HPI is a pleasant 22 year old gentleman who comes in today 3 and half weeks status post left foot talus fracture, left clavicle fracture and right hand fifth metacarpal fracture, date of injury 03/06/2023.  He has been doing much better.  In regards to the left clavicle, he has been wearing a sling.  Minimal discomfort.  He has been taking Tylenol and ibuprofen and occasional tramadol.  In regards to his right hand fifth metacarpal fracture, he has been wearing an ulnar gutter Velcro splint.  Minimal pain when he occasionally removes the splint.  In regards to the left foot, he has been compliant weightbearing in a boot.  Minimal to no pain.  In regards to the right knee and right ankle, he has been applying Xeroform to the superficial abrasions.  These are healing up nicely.  Overall, feeling better everywhere.  Review of Systems as detailed in HPI.  All others reviewed and are negative.   Objective: Vital Signs: There were no vitals taken for this visit.  Physical Exam well-developed and well-nourished gentleman in no acute distress.  Alert and oriented x 3.  Ortho Exam right hand exam: Small superficial abrasion to the ulnar side of the wrist.  Very minimal tenderness to the fifth metacarpal.  Left clavicle exam: Mild tenderness to the midshaft.  No skin tenting.  He is neurovascular intact distally.  Left foot/ankle.  Mild tenderness to the anterior ankle.  Slight discomfort with dorsiflexion.  Right knee, healing superficial abrasion.  Right foot/ankle: Healing superficial abrasion.  No signs of infection or cellulitis.  Specialty Comments:  No specialty comments available.  Imaging: XR Foot Complete Left  Result Date: 03/30/2023 Stable talus fracture  XR Clavicle Left  Result Date: 03/30/2023 Stable midshaft clavicle fracture slight evidence of consolidation  XR Hand Complete Right  Result Date: 03/30/2023 Stable  fifth metacarpal fracture    PMFS History: Patient Active Problem List   Diagnosis Date Noted   Nasal bones, closed fracture 03/12/2023   Past Medical History:  Diagnosis Date   Arthritis    Hypermobile joints     Family History  Problem Relation Age of Onset   Healthy Mother    Healthy Father     History reviewed. No pertinent surgical history. Social History   Occupational History   Not on file  Tobacco Use   Smoking status: Never   Smokeless tobacco: Never  Vaping Use   Vaping status: Never Used  Substance and Sexual Activity   Alcohol use: Yes    Comment: ocassionally   Drug use: Never   Sexual activity: Not on file

## 2023-04-12 ENCOUNTER — Ambulatory Visit: Payer: BC Managed Care – PPO

## 2023-04-27 ENCOUNTER — Ambulatory Visit (INDEPENDENT_AMBULATORY_CARE_PROVIDER_SITE_OTHER): Payer: BC Managed Care – PPO | Admitting: Orthopaedic Surgery

## 2023-04-27 ENCOUNTER — Other Ambulatory Visit (INDEPENDENT_AMBULATORY_CARE_PROVIDER_SITE_OTHER): Payer: BC Managed Care – PPO

## 2023-04-27 DIAGNOSIS — M79672 Pain in left foot: Secondary | ICD-10-CM | POA: Diagnosis not present

## 2023-04-27 DIAGNOSIS — S4992XA Unspecified injury of left shoulder and upper arm, initial encounter: Secondary | ICD-10-CM

## 2023-04-27 DIAGNOSIS — M79642 Pain in left hand: Secondary | ICD-10-CM

## 2023-04-27 DIAGNOSIS — M79641 Pain in right hand: Secondary | ICD-10-CM | POA: Diagnosis not present

## 2023-04-27 NOTE — Progress Notes (Signed)
Office Visit Note   Patient: Anthony Montgomery           Date of Birth: 2000/07/26           MRN: [id number] Visit Date: 04/27/2023              Requested by: No referring provider defined for this encounter. PCP: Pcp, No   Assessment & Plan: Visit Diagnoses:  1. Pain in right hand   2. Pain in left foot   3. Injury of left clavicle, initial encounter   4. Pain in left hand     Plan: Overall very happy with how Anthony Montgomery has improved.  I will make referral to PT and OT for him.  ASO brace provided to wean into.  Work note provided.  Will hold him out of [employer] for 2 months.  Recheck in 6 weeks with repeat x-rays of the left clavicle.  Follow-Up Instructions: Return in about 6 weeks (around 06/08/2023).   Orders:  Orders Placed This Encounter  Procedures   XR Hand Complete Right   XR Clavicle Left   XR Foot Complete Left   XR Hand Complete Left   Ambulatory referral to Physical Therapy   Ambulatory referral to Occupational Therapy   No orders of the defined types were placed in this encounter.     Procedures: No procedures performed   Clinical Data: No additional findings.   Subjective: Chief Complaint  Patient presents with   Right Hand - Follow-up   Left Foot - Follow-up   Left Shoulder - Follow-up    HPI Anthony Montgomery returns today for follow-up for his multiple fractures.  Overall he is feeling much better.  Review of Systems  Constitutional: Negative.   HENT: Negative.    Eyes: Negative.   Respiratory: Negative.    Cardiovascular: Negative.   Gastrointestinal: Negative.   Endocrine: Negative.   Genitourinary: Negative.   Skin: Negative.   Allergic/Immunologic: Negative.   Neurological: Negative.   Hematological: Negative.   Psychiatric/Behavioral: Negative.    All other systems reviewed and are negative.    Objective: Vital Signs: There were no vitals taken for this visit.  Physical Exam Vitals and nursing note reviewed.   Constitutional:      Appearance: He is well-developed.  Pulmonary:     Effort: Pulmonary effort is normal.  Abdominal:     Palpations: Abdomen is soft.  Skin:    General: Skin is warm.  Neurological:     Mental Status: He is alert and oriented to person, place, and time.  Psychiatric:        Behavior: Behavior normal.        Thought Content: Thought content normal.        Judgment: Judgment normal.     Ortho Exam Exam of the left shoulder shows significant improvement in range of motion.  There is no motion or pain through the clavicle fracture site.  Exam of the right hand shows no tenderness at the fracture site.  He is almost able to make a full composite fist.  Exam of the left ankle shows slight tenderness to the medial talus.  No swelling.  Range of motion distally. Specialty Comments:  No specialty comments available.  Imaging: XR Clavicle Left  Result Date: 04/27/2023 X-rays of the left clavicle show ongoing fracture healing.  Alignment is unchanged.  XR Hand Complete Right  Result Date: 04/27/2023 X-rays of the right hand show significant healing to the fifth metacarpal fracture.  Alignment is unchanged.  XR Foot Complete Left  Result Date: 04/27/2023 X-rays of the left foot show healed talus avulsion fracture.  XR Hand Complete Left  Result Date: 04/27/2023 X-rays of the left hand show no acute or structural abnormalities.    PMFS History: Patient Active Problem List   Diagnosis Date Noted   Nasal bones, closed fracture 03/12/2023   Past Medical History:  Diagnosis Date   Arthritis    Hypermobile joints     Family History  Problem Relation Age of Onset   Healthy Mother    Healthy Father     No past surgical history on file. Social History   Occupational History   Not on file  Tobacco Use   Smoking status: Never   Smokeless tobacco: Never  Vaping Use   Vaping status: Never Used  Substance and Sexual Activity   Alcohol use: Yes     Comment: ocassionally   Drug use: Never   Sexual activity: Not on file
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
## 2023-04-30 ENCOUNTER — Other Ambulatory Visit: Payer: Self-pay

## 2023-04-30 ENCOUNTER — Ambulatory Visit (INDEPENDENT_AMBULATORY_CARE_PROVIDER_SITE_OTHER): Payer: BC Managed Care – PPO | Admitting: Rehabilitative and Restorative Service Providers"

## 2023-04-30 ENCOUNTER — Encounter: Payer: Self-pay | Admitting: Rehabilitative and Restorative Service Providers"

## 2023-04-30 DIAGNOSIS — M25641 Stiffness of right hand, not elsewhere classified: Secondary | ICD-10-CM

## 2023-04-30 DIAGNOSIS — S4992XA Unspecified injury of left shoulder and upper arm, initial encounter: Secondary | ICD-10-CM

## 2023-04-30 DIAGNOSIS — M6281 Muscle weakness (generalized): Secondary | ICD-10-CM

## 2023-04-30 DIAGNOSIS — M79641 Pain in right hand: Secondary | ICD-10-CM | POA: Diagnosis not present

## 2023-04-30 DIAGNOSIS — M79672 Pain in left foot: Secondary | ICD-10-CM

## 2023-04-30 DIAGNOSIS — R278 Other lack of coordination: Secondary | ICD-10-CM

## 2023-04-30 DIAGNOSIS — M79642 Pain in left hand: Secondary | ICD-10-CM | POA: Diagnosis not present

## 2023-05-05 ENCOUNTER — Telehealth: Payer: Self-pay | Admitting: Orthopaedic Surgery

## 2023-05-05 NOTE — Telephone Encounter (Signed)
That's fine

## 2023-05-05 NOTE — Telephone Encounter (Signed)
Pt called requesting an update letter to [employer] on his behalf. Pt states Dr Roda Shutters took him out and pt need updated letter to say he is unable to due drills from dates 1/10- 06/06/2023. Please call pt when ready for pick up.

## 2023-05-06 ENCOUNTER — Telehealth: Payer: Self-pay | Admitting: Orthopaedic Surgery

## 2023-05-06 NOTE — Telephone Encounter (Signed)
Notified patient and left note up front.

## 2023-05-06 NOTE — Telephone Encounter (Signed)
Patient calling. Would like to know if the letter is ready?
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
## 2023-05-07 ENCOUNTER — Ambulatory Visit (INDEPENDENT_AMBULATORY_CARE_PROVIDER_SITE_OTHER): Payer: BC Managed Care – PPO | Admitting: Rehabilitative and Restorative Service Providers"

## 2023-05-07 ENCOUNTER — Encounter: Payer: Self-pay | Admitting: Rehabilitative and Restorative Service Providers"

## 2023-05-07 DIAGNOSIS — M25512 Pain in left shoulder: Secondary | ICD-10-CM | POA: Diagnosis not present

## 2023-05-07 DIAGNOSIS — M25572 Pain in left ankle and joints of left foot: Secondary | ICD-10-CM | POA: Diagnosis not present

## 2023-05-07 DIAGNOSIS — R262 Difficulty in walking, not elsewhere classified: Secondary | ICD-10-CM

## 2023-05-07 DIAGNOSIS — M6281 Muscle weakness (generalized): Secondary | ICD-10-CM

## 2023-05-10 ENCOUNTER — Encounter: Payer: BC Managed Care – PPO | Admitting: Rehabilitative and Restorative Service Providers"
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
## 2023-05-24 ENCOUNTER — Ambulatory Visit (INDEPENDENT_AMBULATORY_CARE_PROVIDER_SITE_OTHER): Payer: BC Managed Care – PPO | Admitting: Rehabilitative and Restorative Service Providers"

## 2023-05-24 ENCOUNTER — Encounter: Payer: Self-pay | Admitting: Rehabilitative and Restorative Service Providers"

## 2023-05-24 DIAGNOSIS — R262 Difficulty in walking, not elsewhere classified: Secondary | ICD-10-CM

## 2023-05-24 DIAGNOSIS — R278 Other lack of coordination: Secondary | ICD-10-CM | POA: Diagnosis not present

## 2023-05-24 DIAGNOSIS — M6281 Muscle weakness (generalized): Secondary | ICD-10-CM

## 2023-05-24 DIAGNOSIS — M25512 Pain in left shoulder: Secondary | ICD-10-CM

## 2023-05-24 DIAGNOSIS — M79642 Pain in left hand: Secondary | ICD-10-CM

## 2023-05-24 DIAGNOSIS — M25641 Stiffness of right hand, not elsewhere classified: Secondary | ICD-10-CM | POA: Diagnosis not present

## 2023-05-24 DIAGNOSIS — M25572 Pain in left ankle and joints of left foot: Secondary | ICD-10-CM | POA: Diagnosis not present

## 2023-05-24 DIAGNOSIS — M79641 Pain in right hand: Secondary | ICD-10-CM | POA: Diagnosis not present

## 2023-05-24 NOTE — Therapy (Signed)
OUTPATIENT PHYSICAL THERAPY TREATMENT   Patient Name: Anthony Montgomery MRN: [id number] DOB:Jul 22, 2000, 22 y.o., male Today's Date: 05/24/2023  END OF SESSION:  PT End of Session - 05/24/23 1109     Visit Number 2    Number of Visits 20    Date for PT Re-Evaluation 07/16/23    Authorization Type [employer]BS 20% coninsurance - 30 PT/OT    Progress Note Due on Visit 10    PT Start Time 1106    PT Stop Time 1144    PT Time Calculation (min) 38 min    Activity Tolerance Patient tolerated treatment well    Behavior During Therapy WFL for tasks assessed/performed              Past Medical History:  Diagnosis Date   Arthritis    Hypermobile joints    History reviewed. No pertinent surgical history. Patient Active Problem List   Diagnosis Date Noted   Nasal bones, closed fracture 03/12/2023    PCP: No PCP listed in epic chart  REFERRING PROVIDER: Tarry Kos, MD  REFERRING DIAG: [phone] (ICD-10-CM) - Pain in right hand [phone] (ICD-10-CM) - Pain in left foot S49.92XA (ICD-10-CM) - Injury of left clavicle, initial encounter M79.642 (ICD-10-CM) - Pain in left hand  THERAPY DIAG:  Acute pain of left shoulder  Pain in left ankle and joints of left foot  Muscle weakness (generalized)  Difficulty in walking, not elsewhere classified  Rationale for Evaluation and Treatment: Rehabilitation  ONSET DATE:  Mar 06, 2023  SUBJECTIVE:                                                                                                                                                                                      SUBJECTIVE STATEMENT: Pt indicated some soreness in ankle today after travel.  Has worn brace when tired/sore.  Pt indicated starting to feel normal motion in waking.  No trouble with exercise.  Pt indicated better motion from shoulder.   PERTINENT HISTORY: Hand fractures concurrent, Lt talus fracture, Lt clavicle fracture.   PAIN:  NPRS scale: no specific pain  upon arrival.   Pain location/description: Lt shoulder(can be sharp at times, mostly sore) ,  Lt ankle/foot (soreness) Aggravating factors: Lt shoulder:  overhead reaching    Lt ankle: standing prolonged Relieving factors: nothing specific   PRECAUTIONS: None  WEIGHT BEARING RESTRICTIONS: No  FALLS:  Has patient fallen in last 6 months? 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   In [employer] as well.   PLOF: Independent, running exercise.    PATIENT  GOALS: Reduce pain, get back to activities.    OBJECTIVE:   PATIENT SURVEYS:  05/07/2023 FOTO intake:  48   predicted:  64  COGNITION: 05/07/2023 Overall cognitive status: WFL     SENSATION: 05/07/2023 Not tested  POSTURE: 05/07/2023 Unremarkable  UPPER EXTREMITY ROM:   ROM Right 05/07/2023 Left 05/07/2023  Shoulder flexion [facility] WFL c mild discomfort end range  Shoulder extension    Shoulder abduction [facility] WFL c mild discomfort end range  Shoulder adduction    Shoulder internal rotation [facility]  Shoulder external rotation [facility]  Elbow flexion    Elbow extension    Wrist flexion    Wrist extension    Wrist ulnar deviation    Wrist radial deviation    Wrist pronation    Wrist supination    (Blank rows = not tested)  UPPER EXTREMITY MMT:  MMT Right 05/07/2023 Left 05/07/2023 Left 05/24/2023  Shoulder flexion 5/5 4+/5 5/5  Shoulder extension     Shoulder abduction 5/5 4+/5 5/5  Shoulder adduction     Shoulder internal rotation 5/5 5/5 5/5  Shoulder external rotation 5/5 4/5 4+/5  Middle trapezius     Lower trapezius     Elbow flexion     Elbow extension     Wrist flexion     Wrist extension     Wrist ulnar deviation     Wrist radial deviation     Wrist pronation     Wrist supination     Grip strength (lbs)     (Blank rows = not tested)  LOWER EXTREMITY ROM:      Right 05/07/2023  Left 05/07/2023  Hip flexion    Hip extension    Hip abduction    Hip adduction    Hip internal rotation    Hip external rotation    Knee flexion    Knee extension    Ankle dorsiflexion 12 12  Ankle plantarflexion    Ankle inversion    Ankle eversion     (Blank rows = not tested) LOWER EXTREMITY MMT:    MMT Right 05/07/2023 Left 05/07/2023  Hip flexion    Hip extension    Hip abduction    Hip adduction    Hip internal rotation    Hip external rotation    Knee flexion    Knee extension    Ankle dorsiflexion 5/5 5/5  Ankle plantarflexion 5/5  20 reps 3/5 with 3 reps, some pain noted  Ankle inversion 5/5 4/5  Ankle eversion 5/5 4/5   (Blank rows = not tested)   SPECIAL TESTS: 05/07/2023 Functional squat without restriction due to LE.  Lt SLS EO 30 seconds mild aberrant movement increase, EC increased moderately aberrant movement compared to Rt.   JOINT MOBILITY TESTING:  05/07/2023 No specific testing due to normal ROM noted.   PALPATION:  05/07/2023 No specific tenderness  TODAY'S TREATMENT:                                                                                                       DATE: 05/24/2023 Therex: Recumbent bike Lvl 3 5 mins, seat 6 Lateral step down 6 inch step 2 x 10 bilateral  Double leg up, single leg PF lowering Lt leg  2 x 10   Neuro Re-ed Tandem stance eyes closed on foam in // bars 1 min x 1 bilateral SLS on foam eyes open 30 sec x 2 bilateral  Fitter rocker board fwd/back light touching to ground x 30, balance attempt 1 min  SLS on foam with blazepod reactive light touching 3 on wall/3 on floor 30 sec x 4 bilateral with 15 sec rest breaks     TODAY'S TREATMENT:                                                                                                        DATE: 05/07/2023 Therex:    HEP instruction/performance c cues for techniques, handout provided.  Trial set performed of each for comprehension and symptom assessment.  See below for exercise list   PATIENT EDUCATION: 05/07/2023 Education details: HEP, POC Person educated: Patient Education method: Explanation, Demonstration, Verbal cues, and Handouts Education comprehension: verbalized understanding, returned demonstration, and verbal cues required  HOME EXERCISE PROGRAM: Access Code: [email or web address] URL: [email or web address] Date: 05/07/2023 Prepared by: Chyrel Masson  Exercises - Shoulder External Rotation and Scapular Retraction with Resistance  - 1-2 x daily - 7 x weekly - 1-2 sets - 10-15 reps - Shoulder Extension with Resistance  - 1-2 x daily - 7 x weekly - 1-2 sets - 10-15 reps - Standing shoulder flexion wall slides  - 2-3 x daily - 7 x weekly - 1 sets - 10 reps - 5 hold - Wall Push Up with Plus  - 1-2 x daily - 7 x weekly - 1-2 sets - 10 reps - Long Sitting Ankle Eversion with Resistance  - 2 x daily - 7 x weekly - 3 sets - 10 reps - Long Sitting Ankle Inversion with Resistance  - 2 x daily - 7 x weekly - 3 sets - 10 reps - Standing Bilateral Gastroc Stretch with Step  - 2-3 x daily - 7 x weekly - 1 sets - 30 reps - 3-5 hold - Single Leg Balance with Opposite Leg Star Reach  - 1-2 x daily - 7 x weekly - 1 sets - 10 reps  ASSESSMENT:  CLINICAL IMPRESSION: Strength testing improved from Lt shoulder today compared to eval with ER still lacking mildly.. Pt to benefit from continued  strengthening program for UE and LE as well as progressive balance challenges to improve functional ability and return to normal work outs as desired.     OBJECTIVE IMPAIRMENTS: decreased activity tolerance, decreased balance, decreased coordination, decreased endurance, decreased mobility, difficulty walking, decreased strength, impaired perceived functional  ability, improper body mechanics, and pain.   ACTIVITY LIMITATIONS: carrying, lifting, standing, reach over head, and locomotion level  PARTICIPATION LIMITATIONS: interpersonal relationship, community activity, occupation, and exercise  PERSONAL FACTORS:  Multiple body part injuries,   are also affecting patient's functional outcome.   REHAB POTENTIAL: Good  CLINICAL DECISION MAKING: Stable/uncomplicated  EVALUATION COMPLEXITY: Low   GOALS: Goals reviewed with patient? Yes  SHORT TERM GOALS: (target date for Short term goals are 3 weeks 05/28/2023)  1.Patient will demonstrate independent use of home exercise program to maintain progress from in clinic treatments. Goal status: Met 05/24/2023  LONG TERM GOALS: (target dates for all long term goals are 10 weeks  07/16/2023 )   1. Patient will demonstrate/report pain at worst less than or equal to 2/10 to facilitate minimal limitation in daily activity secondary to pain symptoms. Goal status: New   2. Patient will demonstrate independent use of home exercise program to facilitate ability to maintain/progress functional gains from skilled physical therapy services. Goal status: New   3. Patient will demonstrate FOTO outcome > or = 64 % to indicate reduced disability due to condition. Goal status: New   4.  Patient will demonstrate Lt shoulder MMT 5/5 throughout to facilitate lifting, reaching, carrying at [facility] in daily activity.   Goal status: New   5.  Patient will demonstrate Lt shoulder GH joint AROM WFL s symptoms to facilitate usual overhead reaching, self care, dressing at PLOF.    Goal status: New   6.  Patient will demonstrate bilateral SLS EC 30 seconds each for stability Goal status: New   7.  Patient will demonstrate Lt ankle MMT 5/5 throughout to facilitate usual daily standing, walking Goal Status: New  PLAN:  PT FREQUENCY: 1-2x/week  PT DURATION: 10 weeks  PLANNED INTERVENTIONS: Can include 95284- PT  Re-evaluation, 97110-Therapeutic exercises, 97530- Therapeutic activity, 97112- Neuromuscular re-education, 97535- Self Care, 97140- Manual therapy, [id number]- Gait training, [phone]- Orthotic Fit/training, [phone]- Canalith repositioning, [id number]- Aquatic Therapy, 97014- Electrical stimulation (unattended), [id number]- Electrical stimulation (manual), [id number]- Vasopneumatic device, [id number]- Ultrasound, [id number]- Traction (mechanical), [id number]- Ionotophoresis 4mg /ml Dexamethasone, Patient/Family education, Balance training, Stair training, Taping, Dry Needling, Joint mobilization, Joint manipulation, Spinal manipulation, Spinal mobilization, Scar mobilization, Vestibular training, Visual/preceptual remediation/compensation, DME instructions, Cryotherapy, and Moist heat.  All performed as medically necessary.  All included unless contraindicated  PLAN FOR NEXT SESSION: LTG reassessment, FOTO reassessment, possible HEP transitioning when appropriate.    Chyrel Masson, PT, DPT, OCS, ATC 05/24/23  11:42 AM

## 2023-05-31 ENCOUNTER — Encounter: Payer: BC Managed Care – PPO | Admitting: Rehabilitative and Restorative Service Providers"

## 2023-06-07 ENCOUNTER — Encounter: Payer: BC Managed Care – PPO | Admitting: Rehabilitative and Restorative Service Providers"

## 2023-06-07 ENCOUNTER — Ambulatory Visit (INDEPENDENT_AMBULATORY_CARE_PROVIDER_SITE_OTHER): Payer: BC Managed Care – PPO | Admitting: Rehabilitative and Restorative Service Providers"

## 2023-06-07 ENCOUNTER — Encounter: Payer: Self-pay | Admitting: Rehabilitative and Restorative Service Providers"

## 2023-06-07 DIAGNOSIS — M25572 Pain in left ankle and joints of left foot: Secondary | ICD-10-CM | POA: Diagnosis not present

## 2023-06-07 DIAGNOSIS — R262 Difficulty in walking, not elsewhere classified: Secondary | ICD-10-CM | POA: Diagnosis not present

## 2023-06-07 DIAGNOSIS — M6281 Muscle weakness (generalized): Secondary | ICD-10-CM

## 2023-06-07 DIAGNOSIS — M25512 Pain in left shoulder: Secondary | ICD-10-CM

## 2023-06-07 NOTE — Therapy (Addendum)
 OUTPATIENT PHYSICAL THERAPY TREATMENT / DISCHARGE   Patient Name: Anthony Montgomery MRN: [id number] DOB:May 26, 2000, 23 y.o., male Today's Date: 06/07/2023  END OF SESSION:  PT End of Session - 06/07/23 1045     Visit Number 3    Number of Visits 20    Date for PT Re-Evaluation 07/16/23    Authorization Type [employer] 20% coninsurance - 30 PT/OT    Progress Note Due on Visit 10    PT Start Time 1048    PT Stop Time 1128    PT Time Calculation (min) 40 min    Activity Tolerance Patient tolerated treatment well    Behavior During Therapy WFL for tasks assessed/performed               Past Medical History:  Diagnosis Date   Arthritis    Hypermobile joints    History reviewed. No pertinent surgical history. Patient Active Problem List   Diagnosis Date Noted   Nasal bones, closed fracture 03/12/2023    PCP: No PCP listed in epic chart  REFERRING PROVIDER: Tarry Kos, MD  REFERRING DIAG: [phone] (ICD-10-CM) - Pain in right hand [phone] (ICD-10-CM) - Pain in left foot S49.92XA (ICD-10-CM) - Injury of left clavicle, initial encounter M79.642 (ICD-10-CM) - Pain in left hand  THERAPY DIAG:  Acute pain of left shoulder  Pain in left ankle and joints of left foot  Muscle weakness (generalized)  Difficulty in walking, not elsewhere classified  Rationale for Evaluation and Treatment: Rehabilitation  ONSET DATE:  Mar 06, 2023  SUBJECTIVE:                                                                                                                                                                                      SUBJECTIVE STATEMENT: Pt indicated some soreness that gets better over night.  Noted after prolonged walking.   PERTINENT HISTORY: Hand fractures concurrent, Lt talus fracture, Lt clavicle fracture.   PAIN:  NPRS scale: sore 4/10 Pain location/description:   Lt ankle/foot (soreness) Aggravating factors:walking prolonged  Relieving factors: nothing  specific   PRECAUTIONS: None  WEIGHT BEARING RESTRICTIONS: No  FALLS:  Has patient fallen in last 6 months? 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   In [employer] as well.   PLOF: Independent, running exercise.    PATIENT GOALS: Reduce pain, get back to activities.    OBJECTIVE:   PATIENT SURVEYS:  06/07/2023: FOTO update:  67  05/07/2023 FOTO intake:  48   predicted:  64  COGNITION: 05/07/2023 Overall cognitive status: [facility]  SENSATION: 05/07/2023 Not tested  POSTURE: 05/07/2023 Unremarkable  UPPER EXTREMITY ROM:   ROM Right 05/07/2023 Left 05/07/2023  Shoulder flexion [facility] WFL c mild discomfort end range  Shoulder extension    Shoulder abduction [facility] WFL c mild discomfort end range  Shoulder adduction    Shoulder internal rotation [facility]  Shoulder external rotation [facility]  Elbow flexion    Elbow extension    Wrist flexion    Wrist extension    Wrist ulnar deviation    Wrist radial deviation    Wrist pronation    Wrist supination    (Blank rows = not tested)  UPPER EXTREMITY MMT:  MMT Right 05/07/2023 Left 05/07/2023 Left 05/24/2023 Left 06/07/2023  Shoulder flexion 5/5 4+/5 5/5   Shoulder extension      Shoulder abduction 5/5 4+/5 5/5   Shoulder adduction      Shoulder internal rotation 5/5 5/5 5/5   Shoulder external rotation 5/5 4/5 4+/5 5/5  Middle trapezius      Lower trapezius      Elbow flexion      Elbow extension      Wrist flexion      Wrist extension      Wrist ulnar deviation      Wrist radial deviation      Wrist pronation      Wrist supination      Grip strength (lbs)      (Blank rows = not tested)  LOWER EXTREMITY ROM:      Right 05/07/2023 Left 05/07/2023  Hip flexion    Hip extension    Hip abduction    Hip adduction    Hip internal rotation    Hip external rotation    Knee flexion    Knee extension     Ankle dorsiflexion 12 12  Ankle plantarflexion    Ankle inversion    Ankle eversion     (Blank rows = not tested) LOWER EXTREMITY MMT:    MMT Right 05/07/2023 Left 05/07/2023 Right 06/07/2023 Left 06/07/2023  Hip flexion      Hip extension      Hip abduction      Hip adduction      Hip internal rotation      Hip external rotation      Knee flexion      Knee extension      Ankle dorsiflexion 5/5 5/5    Ankle plantarflexion 5/5  20 reps 3/5 with 3 reps, some pain noted  5/5  Ankle inversion 5/5 4/5  5/5  Ankle eversion 5/5 4/5  5/5   (Blank rows = not tested)   SPECIAL TESTS: 05/07/2023 Functional squat without restriction due to LE.  Lt SLS EO 30 seconds mild aberrant movement increase, EC increased moderately aberrant movement compared to Rt.   JOINT MOBILITY TESTING:  05/07/2023 No specific testing due to normal ROM noted.   PALPATION:  05/07/2023 No specific tenderness  TODAY'S TREATMENT:                                                                                                       DATE: 06/07/2023 Therex: Recumbent bike Lvl 3 10 mins, seat 6 Single leg Lt PF x 20 Incline gastroc stretch 30 sec x3    Verbal review of existing HEP.   Neuro Re-ed Tandem stance eyes closed on foam in // bars 1 min x 1 bilateral SLS on foam eyes open 30 sec x 2 bilateral  Fitter rocker board fwd/back light touching to ground x 25, balance attempt 1 min  SLS on foam with blazepod reactive light touching 3 on wall/3 on floor 30 sec x 4 bilateral with 15 sec rest breaks  Lateral stepping 3 blazepod lights 2 second time out 30 sec x 4 bilaterally with 15 sec rest breaks  TODAY'S TREATMENT:                                                                                                        DATE: 05/24/2023 Therex: Recumbent bike Lvl 3 5 mins, seat 6 Lateral step down 6 inch step 2 x 10 bilateral  Double leg up, single leg PF lowering Lt leg  2 x 10   Neuro Re-ed Tandem stance eyes closed on foam in // bars 1 min x 1 bilateral SLS on foam eyes open 30 sec x 2 bilateral  Fitter rocker board fwd/back light touching to ground x 30, balance attempt 1 min  SLS on foam with blazepod reactive light touching 3 on wall/3 on floor 30 sec x 4 bilateral with 15 sec rest breaks    PATIENT EDUCATION: 05/07/2023 Education details: HEP, POC Person educated: Patient Education method: Programmer, multimedia, Facilities manager, Verbal cues, and Handouts Education comprehension: verbalized understanding, returned demonstration, and verbal cues required  HOME EXERCISE PROGRAM: Access Code: [email or web address] URL: [email or web address] Date: 05/07/2023 Prepared by: Chyrel Masson  Exercises - Shoulder External Rotation and Scapular Retraction with Resistance  - 1-2 x daily - 7 x weekly - 1-2 sets - 10-15 reps - Shoulder Extension with Resistance  - 1-2 x daily - 7 x weekly - 1-2 sets - 10-15 reps - Standing shoulder flexion wall slides  - 2-3 x daily - 7 x weekly - 1 sets - 10 reps - 5 hold - Wall Push Up with Plus  - 1-2 x daily - 7 x weekly - 1-2 sets - 10 reps - Long Sitting Ankle Eversion with Resistance  - 2 x daily - 7 x weekly - 3 sets - 10 reps - Long Sitting Ankle Inversion with Resistance  - 2 x  daily - 7 x weekly - 3 sets - 10 reps - Standing Bilateral Gastroc Stretch with Step  - 2-3 x daily - 7 x weekly - 1 sets - 30 reps - 3-5 hold - Single Leg Balance with Opposite Leg Star Reach  - 1-2 x daily - 7 x weekly - 1 sets - 10 reps  ASSESSMENT:  CLINICAL IMPRESSION: FOTO showed improvement compared to evaluation.  At this time due to overall improvement, Pt was recommended for trial HEP.  Pt was in agreement.  LTGs were primarily met when reassessed today.    OBJECTIVE IMPAIRMENTS:  decreased activity tolerance, decreased balance, decreased coordination, decreased endurance, decreased mobility, difficulty walking, decreased strength, impaired perceived functional ability, improper body mechanics, and pain.   ACTIVITY LIMITATIONS: carrying, lifting, standing, reach over head, and locomotion level  PARTICIPATION LIMITATIONS: interpersonal relationship, community activity, occupation, and exercise  PERSONAL FACTORS:  Multiple body part injuries,   are also affecting patient's functional outcome.   REHAB POTENTIAL: Good  CLINICAL DECISION MAKING: Stable/uncomplicated  EVALUATION COMPLEXITY: Low   GOALS: Goals reviewed with patient? Yes  SHORT TERM GOALS: (target date for Short term goals are 3 weeks 05/28/2023)  1.Patient will demonstrate independent use of home exercise program to maintain progress from in clinic treatments. Goal status: Met 05/24/2023  LONG TERM GOALS: (target dates for all long term goals are 10 weeks  07/16/2023 )   1. Patient will demonstrate/report pain at worst less than or equal to 2/10 to facilitate minimal limitation in daily activity secondary to pain symptoms. Goal status: on going 06/07/2023   2. Patient will demonstrate independent use of home exercise program to facilitate ability to maintain/progress functional gains from skilled physical therapy services. Goal status:  on going 06/07/2023   3. Patient will demonstrate FOTO outcome > or = 64 % to indicate reduced disability due to condition. Goal status: Met 06/07/2023   4.  Patient will demonstrate Lt shoulder MMT 5/5 throughout to facilitate lifting, reaching, carrying at [facility] in daily activity.   Goal status: Met  06/07/2023   5.  Patient will demonstrate Lt shoulder GH joint AROM WFL s symptoms to facilitate usual overhead reaching, self care, dressing at PLOF.    Goal status:  Met 06/07/2023   6.  Patient will demonstrate bilateral SLS EC 30 seconds each for stability Goal  status: Met  06/07/2023   7.  Patient will demonstrate Lt ankle MMT 5/5 throughout to facilitate usual daily standing, walking Goal Status:  Met 06/07/2023  PLAN:  PT FREQUENCY: 1-2x/week  PT DURATION: 10 weeks  PLANNED INTERVENTIONS: Can include 25366- PT Re-evaluation, 97110-Therapeutic exercises, 97530- Therapeutic activity, 97112- Neuromuscular re-education, 97535- Self Care, 97140- Manual therapy, [id number]- Gait training, [phone]- Orthotic Fit/training, [phone]- Canalith repositioning, [id number]- Aquatic Therapy, 97014- Electrical stimulation (unattended), [id number]- Electrical stimulation (manual), [id number]- Vasopneumatic device, [id number]- Ultrasound, [id number]- Traction (mechanical), [id number]- Ionotophoresis 4mg /ml Dexamethasone, Patient/Family education, Balance training, Stair training, Taping, Dry Needling, Joint mobilization, Joint manipulation, Spinal manipulation, Spinal mobilization, Scar mobilization, Vestibular training, Visual/preceptual remediation/compensation, DME instructions, Cryotherapy, and Moist heat.  All performed as medically necessary.  All included unless contraindicated  PLAN FOR NEXT SESSION: Trial HEP period.    Chyrel Masson, PT, DPT, OCS, ATC 06/07/23  11:32 AM    PHYSICAL THERAPY DISCHARGE SUMMARY  Visits from Start of Care: 3  Current functional level related to goals / functional outcomes: See note   Remaining deficits: See note   Education / Equipment: HEP  Patient goals were  met. Patient is being discharged due to not returning since the last visit.  Chyrel Masson, PT, DPT, OCS, ATC 08/30/23  8:17 AM

## 2023-06-07 NOTE — Progress Notes (Signed)
 Office Visit Note   Patient: Anthony Montgomery           Date of Birth: 05-12-2001           MRN: [id number] Visit Date: 06/08/2023              Requested by: No referring provider defined for this encounter. PCP: Pcp, No   Assessment & Plan: Visit Diagnoses:  1. Injury of left clavicle, initial encounter   2. Pain in left foot   3. Pain in right hand   4. Pain in left hand     Plan: Clavicle has now demonstrated a radiographic and clinical healing.  He is released activity as tolerated.  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  Would expect this to work itself out over the next 6 to 12 months.  He is released to physical training in the [employer] at this time.  Follow-up as needed.  Follow-Up Instructions: No follow-ups on file.   Orders:  Orders Placed This Encounter  Procedures   XR Clavicle Left   No orders of the defined types were placed in this encounter.     Procedures: No procedures performed   Clinical Data: No additional findings.   Subjective: Chief Complaint  Patient presents with   Left Shoulder - Pain    HPI Patient returns today for follow-up evaluation of multiple orthopedic injuries. Review of Systems  Constitutional: Negative.   HENT: Negative.    Eyes: Negative.   Respiratory: Negative.    Cardiovascular: Negative.   Gastrointestinal: Negative.   Endocrine: Negative.   Genitourinary: Negative.   Skin: Negative.   Allergic/Immunologic: Negative.   Neurological: Negative.   Hematological: Negative.   Psychiatric/Behavioral: Negative.    All other systems reviewed and are negative.    Objective: Vital Signs: There were no vitals taken for this visit.  Physical Exam Vitals and nursing note reviewed.  Constitutional:      Appearance: He is well-developed.  HENT:     Head: Normocephalic and atraumatic.  Eyes:     Pupils: Pupils are equal, round, and reactive to light.   Pulmonary:     Effort: Pulmonary effort is normal.  Abdominal:     Palpations: Abdomen is soft.  Musculoskeletal:        General: Normal range of motion.     Cervical back: Neck supple.  Skin:    General: Skin is warm.  Neurological:     Mental Status: He is alert and oriented to person, place, and time.  Psychiatric:        Behavior: Behavior normal.        Thought Content: Thought content normal.        Judgment: Judgment normal.    Ortho Exam Examination of the left shoulder girdle shows no tenderness along the clavicle fracture site.  He has full range of motion and function.  Examination of the left ankle shows no swelling.  Slight discomfort with ankle inversion. Specialty Comments:  No specialty comments available.  Imaging: XR Clavicle Left Result Date: 06/08/2023 X-rays of the left clavicle show considerable fracture consolidation and callus formation.    PMFS History: Patient Active Problem List   Diagnosis Date Noted   Nasal bones, closed fracture 03/12/2023   Past Medical History:  Diagnosis Date   Arthritis    Hypermobile joints     Family History  Problem Relation Age of Onset   Healthy Mother    Healthy  Father     History reviewed. No pertinent surgical history. Social History   Occupational History   Not on file  Tobacco Use   Smoking status: Never   Smokeless tobacco: Never  Vaping Use   Vaping status: Never Used  Substance and Sexual Activity   Alcohol use: Yes    Comment: ocassionally   Drug use: Never   Sexual activity: Not on file

## 2023-06-08 ENCOUNTER — Ambulatory Visit (INDEPENDENT_AMBULATORY_CARE_PROVIDER_SITE_OTHER): Payer: BC Managed Care – PPO | Admitting: Orthopaedic Surgery

## 2023-06-08 ENCOUNTER — Other Ambulatory Visit (INDEPENDENT_AMBULATORY_CARE_PROVIDER_SITE_OTHER): Payer: BC Managed Care – PPO

## 2023-06-08 ENCOUNTER — Encounter: Payer: Self-pay | Admitting: Orthopaedic Surgery

## 2023-06-08 DIAGNOSIS — S4992XA Unspecified injury of left shoulder and upper arm, initial encounter: Secondary | ICD-10-CM

## 2023-06-08 DIAGNOSIS — M79642 Pain in left hand: Secondary | ICD-10-CM | POA: Diagnosis not present

## 2023-06-08 DIAGNOSIS — M79641 Pain in right hand: Secondary | ICD-10-CM

## 2023-06-08 DIAGNOSIS — M79672 Pain in left foot: Secondary | ICD-10-CM

## 2023-07-01 ENCOUNTER — Ambulatory Visit: Payer: BC Managed Care – PPO | Admitting: Orthopaedic Surgery

## 2023-07-07 NOTE — Progress Notes (Deleted)
   Office Visit Note   Patient: Anthony Montgomery           Date of Birth: 05-26-2000           MRN: [id number] Visit Date: 07/09/2023              Requested by: No referring provider defined for this encounter. PCP: Pcp, No   Assessment & Plan: Visit Diagnoses:  1. Injury of left clavicle, initial encounter   2. Pain in left hand   3. Pain in right hand   4. Pain in left foot     Plan: ***  Follow-Up Instructions: No follow-ups on file.   Orders:  No orders of the defined types were placed in this encounter.  No orders of the defined types were placed in this encounter.     Procedures: No procedures performed   Clinical Data: No additional findings.   Subjective: No chief complaint on file.   HPI  Review of Systems   Objective: Vital Signs: There were no vitals taken for this visit.  Physical Exam  Ortho Exam  Specialty Comments:  No specialty comments available.  Imaging: No results found.   PMFS History: Patient Active Problem List   Diagnosis Date Noted   Nasal bones, closed fracture 03/12/2023   Past Medical History:  Diagnosis Date   Arthritis    Hypermobile joints     Family History  Problem Relation Age of Onset   Healthy Mother    Healthy Father     No past surgical history on file. Social History   Occupational History   Not on file  Tobacco Use   Smoking status: Never   Smokeless tobacco: Never  Vaping Use   Vaping status: Never Used  Substance and Sexual Activity   Alcohol use: Yes    Comment: ocassionally   Drug use: Never   Sexual activity: Not on file

## 2023-07-09 ENCOUNTER — Ambulatory Visit: Payer: BC Managed Care – PPO | Admitting: Orthopaedic Surgery

## 2023-07-09 DIAGNOSIS — M79672 Pain in left foot: Secondary | ICD-10-CM

## 2023-07-09 DIAGNOSIS — M79642 Pain in left hand: Secondary | ICD-10-CM

## 2023-07-09 DIAGNOSIS — S4992XA Unspecified injury of left shoulder and upper arm, initial encounter: Secondary | ICD-10-CM

## 2023-07-09 DIAGNOSIS — M79641 Pain in right hand: Secondary | ICD-10-CM

## 2023-07-13 ENCOUNTER — Encounter: Payer: Self-pay | Admitting: Orthopaedic Surgery

## 2023-07-13 ENCOUNTER — Ambulatory Visit (INDEPENDENT_AMBULATORY_CARE_PROVIDER_SITE_OTHER): Payer: BC Managed Care – PPO | Admitting: Orthopaedic Surgery

## 2023-07-13 DIAGNOSIS — M79672 Pain in left foot: Secondary | ICD-10-CM

## 2023-07-13 DIAGNOSIS — S4992XA Unspecified injury of left shoulder and upper arm, initial encounter: Secondary | ICD-10-CM | POA: Diagnosis not present

## 2023-07-13 DIAGNOSIS — M79642 Pain in left hand: Secondary | ICD-10-CM

## 2023-07-13 DIAGNOSIS — M79641 Pain in right hand: Secondary | ICD-10-CM | POA: Diagnosis not present

## 2023-07-13 MED ORDER — IBUPROFEN 800 MG PO TABS: 800.0000 mg | ORAL_TABLET | Freq: Three times a day (TID) | ORAL | 2 refills | Status: AC | PRN

## 2023-07-13 NOTE — Progress Notes (Signed)
Office Visit Note   Patient: Anthony Montgomery           Date of Birth: [birth year]           MRN: [id number] Visit Date: 07/13/2023              Requested by: No referring provider defined for this encounter. PCP: Pcp, No   Assessment & Plan: Visit Diagnoses:  1. Injury of left clavicle, initial encounter   2. Pain in left foot   3. Pain in right hand   4. Pain in left hand     Plan: From my standpoint I am fine with the patient being discharged from the [facility] due to medical reasons.  The form was filled out.  Questions encouraged and answered.  Motrin refilled.  Follow-up as needed.  Follow-Up Instructions: No follow-ups on file.   Orders:  No orders of the defined types were placed in this encounter.  Meds ordered this encounter  Medications   ibuprofen (ADVIL) 800 MG tablet    Sig: Take 1 tablet (800 mg total) by mouth every 8 (eight) hours as needed.    Dispense:  30 tablet    Refill:  2      Procedures: No procedures performed   Clinical Data: No additional findings.   Subjective: Chief Complaint  Patient presents with   Left Shoulder - Follow-up    HPI Patient comes in today for follow-up for his numerous orthopedic injuries.  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  Review of Systems  Constitutional: Negative.   HENT: Negative.    Eyes: Negative.   Respiratory: Negative.    Cardiovascular: Negative.   Gastrointestinal: Negative.   Endocrine: Negative.   Genitourinary: Negative.   Skin: Negative.   Allergic/Immunologic: Negative.   Neurological: Negative.   Hematological: Negative.   Psychiatric/Behavioral: Negative.    All other systems reviewed and are negative.    Objective: Vital Signs: There were no vitals taken for this visit.  Physical Exam Vitals and nursing note reviewed.  Constitutional:      Appearance: He is well-developed.  Pulmonary:     Effort: Pulmonary  effort is normal.  Abdominal:     Palpations: Abdomen is soft.  Skin:    General: Skin is warm.  Neurological:     Mental Status: He is alert and oriented to person, place, and time.  Psychiatric:        Behavior: Behavior normal.        Thought Content: Thought content normal.        Judgment: Judgment normal.     Ortho Exam Examinations of his multiple orthopedic injuries sites are unchanged from prior visit. Specialty Comments:  No specialty comments available.  Imaging: No results found.   PMFS History: Patient Active Problem List   Diagnosis Date Noted   Nasal bones, closed fracture 03/12/2023   Past Medical History:  Diagnosis Date   Arthritis    Hypermobile joints     Family History  Problem Relation Age of Onset   Healthy Mother    Healthy Father     History reviewed. No pertinent surgical history. Social History   Occupational History   Not on file  Tobacco Use   Smoking status: Never   Smokeless tobacco: Never  Vaping Use   Vaping status: Never Used  Substance and Sexual Activity   Alcohol use: Yes    Comment: ocassionally  Drug use: Never   Sexual activity: Not on file

## 2023-08-03 ENCOUNTER — Ambulatory Visit: Admitting: Orthopaedic Surgery

## 2023-08-11 ENCOUNTER — Ambulatory Visit: Admitting: Orthopaedic Surgery

## 2023-08-11 NOTE — Progress Notes (Unsigned)
   Office Visit Note   Patient: Anthony Montgomery           Date of Birth: 06-07-2000           MRN: [id number] Visit Date: 08/12/2023              Requested by: No referring provider defined for this encounter. PCP: Pcp, No   Assessment & Plan: Visit Diagnoses: No diagnosis found.  Plan: ***  Follow-Up Instructions: No follow-ups on file.   Orders:  No orders of the defined types were placed in this encounter. No orders of the defined types were placed in this encounter.    Procedures: No procedures performed   Clinical Data: No additional findings.   Subjective: No chief complaint on file.  HPI  Review of Systems   Objective: Vital Signs: There were no vitals taken for this visit.  Physical Exam  Ortho Exam  Specialty Comments:  No specialty comments available.  Imaging: No results found.   PMFS History: Patient Active Problem List   Diagnosis Date Noted  . Nasal bones, closed fracture 03/12/2023   Past Medical History:  Diagnosis Date  . Arthritis   . Hypermobile joints     Family History  Problem Relation Age of Onset  . Healthy Mother   . Healthy Father     No past surgical history on file. Social History   Occupational History  . Not on file  Tobacco Use  . Smoking status: Never  . Smokeless tobacco: Never  Vaping Use  . Vaping status: Never Used  Substance and Sexual Activity  . Alcohol use: Yes    Comment: ocassionally  . Drug use: Never  . Sexual activity: Not on file

## 2023-08-12 ENCOUNTER — Ambulatory Visit: Admitting: Orthopaedic Surgery

## 2023-08-16 NOTE — Progress Notes (Unsigned)
   Office Visit Note   Patient: Anthony Montgomery           Date of Birth: [birth year]           MRN: [id number] Visit Date: 08/17/2023              Requested by: No referring provider defined for this encounter. PCP: Pcp, No   Assessment & Plan: Visit Diagnoses: No diagnosis found.  Plan: ***  Follow-Up Instructions: No follow-ups on file.   Orders:  No orders of the defined types were placed in this encounter. No orders of the defined types were placed in this encounter.    Procedures: No procedures performed   Clinical Data: No additional findings.   Subjective: No chief complaint on file.  HPI  Review of Systems   Objective: Vital Signs: There were no vitals taken for this visit.  Physical Exam  Ortho Exam  Specialty Comments:  No specialty comments available.  Imaging: No results found.   PMFS History: Patient Active Problem List   Diagnosis Date Noted  . Nasal bones, closed fracture 03/12/2023   Past Medical History:  Diagnosis Date  . Arthritis   . Hypermobile joints     Family History  Problem Relation Age of Onset  . Healthy Mother   . Healthy Father     No past surgical history on file. Social History   Occupational History  . Not on file  Tobacco Use  . Smoking status: Never  . Smokeless tobacco: Never  Vaping Use  . Vaping status: Never Used  Substance and Sexual Activity  . Alcohol use: Yes    Comment: ocassionally  . Drug use: Never  . Sexual activity: Not on file

## 2023-08-17 ENCOUNTER — Ambulatory Visit (INDEPENDENT_AMBULATORY_CARE_PROVIDER_SITE_OTHER): Admitting: Orthopaedic Surgery

## 2023-08-17 DIAGNOSIS — S161XXA Strain of muscle, fascia and tendon at neck level, initial encounter: Secondary | ICD-10-CM | POA: Diagnosis not present

## 2023-08-30 ENCOUNTER — Encounter: Payer: Self-pay | Admitting: Physical Therapy

## 2023-08-30 ENCOUNTER — Ambulatory Visit (INDEPENDENT_AMBULATORY_CARE_PROVIDER_SITE_OTHER): Admitting: Physical Therapy

## 2023-08-30 DIAGNOSIS — R29898 Other symptoms and signs involving the musculoskeletal system: Secondary | ICD-10-CM | POA: Diagnosis not present

## 2023-08-30 DIAGNOSIS — M542 Cervicalgia: Secondary | ICD-10-CM

## 2023-08-30 DIAGNOSIS — R293 Abnormal posture: Secondary | ICD-10-CM
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
## 2023-09-14 ENCOUNTER — Encounter: Payer: Self-pay | Admitting: Physical Therapy

## 2023-09-14 ENCOUNTER — Ambulatory Visit (INDEPENDENT_AMBULATORY_CARE_PROVIDER_SITE_OTHER): Admitting: Physical Therapy

## 2023-09-14 DIAGNOSIS — R29898 Other symptoms and signs involving the musculoskeletal system: Secondary | ICD-10-CM

## 2023-09-14 DIAGNOSIS — R293 Abnormal posture: Secondary | ICD-10-CM | POA: Diagnosis not present

## 2023-09-14 DIAGNOSIS — M542 Cervicalgia: Secondary | ICD-10-CM
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
## 2023-09-14 NOTE — Patient Instructions (Signed)

## 2023-09-20 ENCOUNTER — Ambulatory Visit (INDEPENDENT_AMBULATORY_CARE_PROVIDER_SITE_OTHER): Admitting: Physical Therapy

## 2023-09-20 ENCOUNTER — Encounter: Payer: Self-pay | Admitting: Physical Therapy

## 2023-09-20 DIAGNOSIS — R29898 Other symptoms and signs involving the musculoskeletal system: Secondary | ICD-10-CM | POA: Diagnosis not present

## 2023-09-20 DIAGNOSIS — M25512 Pain in left shoulder: Secondary | ICD-10-CM

## 2023-09-20 DIAGNOSIS — R293 Abnormal posture: Secondary | ICD-10-CM

## 2023-09-20 DIAGNOSIS — M542 Cervicalgia: Secondary | ICD-10-CM | POA: Diagnosis not present

## 2023-09-20 NOTE — Therapy (Signed)
 OUTPATIENT PHYSICAL THERAPY TREATMENT   Patient Name: Ascher Bertrand MRN: [id number] DOB:02-17-2001, 23 y.o., male Today's Date: 09/20/2023  END OF SESSION:  PT End of Session - 09/20/23 0805     Visit Number 3    Number of Visits 6    Date for PT Re-Evaluation 10/11/23    Authorization Type BCBS 50% coninsurance - 30 PT/OT    PT Start Time 0802    PT Stop Time 0841    PT Time Calculation (min) 39 min    Activity Tolerance Patient tolerated treatment well    Behavior During Therapy WFL for tasks assessed/performed               Past Medical History:  Diagnosis Date   Arthritis    Hypermobile joints    History reviewed. No pertinent surgical history. Patient Active Problem List   Diagnosis Date Noted   Neck strain, initial encounter 08/17/2023   Nasal bones, closed fracture 03/12/2023    PCP: Pcp, No  REFERRING PROVIDER: Wes Hamman, MD  REFERRING DIAG: S16.1XXA (ICD-10-CM) - Neck strain, initial encounter  Rationale for Evaluation and Treatment: Rehabilitation  THERAPY DIAG:  Cervicalgia  Abnormal posture  Other symptoms and signs involving the musculoskeletal system  Acute pain of left shoulder  ONSET DATE: 03/06/23   SUBJECTIVE:                                                                                                                                                                                           SUBJECTIVE STATEMENT: Feel better after last session, still having some pain but not as tight   PERTINENT HISTORY:  Hand fractures concurrent, Lt talus fracture, Lt clavicle fracture.   PAIN:  Are you having pain? Yes: NPRS scale: 4 currently, up to 6, at best 2/10 Pain location: posterior neck, Lt side tightness; occasional shooting pains to collar bones Pain description: "like a jammed finger" dull, ache, occasional stabbing Aggravating factors: looking down, wearing anything on head that has weight (helmet) Relieving  factors: flexion and rotation stretch  PRECAUTIONS:  None  RED FLAGS: None   WEIGHT BEARING RESTRICTIONS:  No  FALLS:  Has patient fallen in last 6 months? 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   PLOF:  Independent and Leisure: running, fishing, hiking  PATIENT GOALS:  Improve pain in neck   OBJECTIVE:  Note: Objective measures were completed at Evaluation unless otherwise noted.  DIAGNOSTIC FINDINGS:  CT scans of the neck have been negative  PATIENT SURVEYS:  Patient-Specific Activity Scoring Scheme  "0" represents "unable to perform." "10" represents "able to perform at prior level. 0 1 2 3 4 5 6 7 8 9  10 (Date and Score)   Activity Eval     1. Lie comfortably 5    2. Wearing a helmet 1    3. Sitting at a desk 6   Score 4    Total score = sum of the activity scores/number of activities Minimum detectable change (90%CI) for average score = 2 points Minimum detectable change (90%CI) for single activity score = 3 points    COGNITIVE STATUS: Within functional limits for tasks assessed   SENSATION: C/o occasional numbness and tingling to lower cervical spine, no radiating symptoms  POSTURE:  rounded shoulders, forward head, and increased thoracic kyphosis  HAND DOMINANCE:  Right  GAIT: 08/30/23 Comments: mild gait deviations due to prior foot injury; no signs of imbalance   PALPATION: 08/30/23 tightness and trigger points in bil cervical paraspinals (Lt worse than Rt) and into suboccipitals; pain with CPA Gr2 mobs C4-7   CERVICAL ROM:   ROM A/PROM (deg) eval  Flexion 34 (pain)  Extension 38  Right lateral flexion 35 (pain)  Left lateral flexion 37 (pain)  Right rotation 65  Left rotation 55   (Blank rows = not tested)   UPPER EXTREMITY ROM: 08/30/23: Gross screen  demonstrated WFL ROM bil UEs   SPECIAL TESTS:  08/30/23 Cervical Spurling's test: negative; decreased symptoms with pressure   TREATMENT:                                                                                                                              DATE:  09/20/23 TherEx UBE L4 x 8 min (4 min each direction) Rows L4 band 2x10; 5 sec hold Shoulder ext L4 band 2x10; 5 sec hold BATCA lat pull downs 45# 2x10 BATCA rows 45# 2x10 Counter push ups 2x10  Manual STM with compression to bil cervical paraspinals and suboccipitals; Gr 2 C6-7 CPA mobs to tolerance; skilled palpation and monitoring of soft tissue during DN  Trigger Point Dry Needling  Subsequent Treatment: Instructions provided previously at initial dry needling treatment.   Patient Verbal Consent Given: Yes Education Handout Provided: Previously Provided Muscles Treated: bil suboccipitals, C6 multifidi bil, bil upper cervical paraspinals and oblique capitus Electrical Stimulation Performed: No Treatment Response/Outcome: twitch responses with decreased pain     09/14/23 TherEx UBE L4 x 8 min (4 min each direction) Seated cervical retraction 10 x 5 sec hold Seated scapular retraction 10 x 5 sec hold Seated levator stretch x 30 sec bil Seated upper trap stretch x 30 sec bil Seated scalene stretch x 30 sec bil  Manual STM with compression to bil cervical paraspinals; Gr 2 C6-7 CPA mobs to tolerance; skilled palpation and monitoring of soft tissue during DN  Trigger Point Dry Needling  Initial Treatment: Pt instructed on Dry Needling rational, procedures, and possible side effects. Pt instructed to expect mild to moderate muscle soreness later in the day and/or into the next day.  Pt instructed in methods to reduce muscle soreness. Pt instructed to continue prescribed HEP. Patient was educated on signs and symptoms of infection and other risk factors and advised to seek medical attention should they  occur.  Patient verbalized understanding of these instructions and education.   Patient Verbal Consent Given: Yes Education Handout Provided: Yes Muscles Treated: C6 multifidi, bil cervical paraspinals Electrical Stimulation Performed: No Treatment Response/Outcome: twitch responses; decreased tightness and increased subjective ROM (not formally measured)     08/30/23 TherEx See HEP - demonstrated with trial reps performed PRN, min cues for comprehension    Self Care Educated on POC and clinical findings     PATIENT EDUCATION:  Education details: HEP Person educated: Patient Education method: Programmer, multimedia, Facilities manager, and Handouts Education comprehension: verbalized understanding, returned demonstration, and needs further education  HOME EXERCISE PROGRAM: Access Code: [email or web address] URL: [email or web address] Date: 08/30/2023 Prepared by: Casimer Clear  Exercises - Supine Chin Tuck  - 2 x daily - 7 x weekly - 1 sets - 10 reps - 5 sec hold - Standing Scapular Retraction  - 2 x daily - 7 x weekly - 1 sets - 10 reps - 5 sec hold - Seated Levator Scapulae Stretch  - 2 x daily - 7 x weekly - 1 sets - 1 reps - 30 sec hold - Seated Upper Trapezius Stretch  - 2 x daily - 7 x weekly - 1 sets - 3 reps - 30 sec hold - Seated Scalenes Stretch  - 2 x daily - 7 x weekly - 1 sets - 3 reps - 30 sec hold   ASSESSMENT:  CLINICAL IMPRESSION: Pt with reduction in symptoms after last session and therefore repeated DN today to other areas of pain.  Will continue to benefit from PT to maximize function.  Did begin some more strengthening today and recommend he try to return to gym and see how that goes.     OBJECTIVE IMPAIRMENTS: decreased ROM, increased fascial restrictions, increased muscle spasms, impaired UE functional use, postural dysfunction, and pain.   ACTIVITY LIMITATIONS: carrying, lifting, reach over head, hygiene/grooming, and locomotion level  PARTICIPATION  LIMITATIONS: meal prep, cleaning, laundry, driving, shopping, community activity, occupation, and yard work  PERSONAL FACTORS: 3+ comorbidities: Hand fractures concurrent, Lt talus fracture, Lt clavicle fracture.   are also affecting patient's functional outcome.   REHAB POTENTIAL: Good  CLINICAL DECISION MAKING: Stable/uncomplicated  EVALUATION COMPLEXITY: Low   GOALS: Goals reviewed with patient? Yes  SHORT TERM GOALS: Target date: 09/20/2023  Independent with initial HEP Goal status: MET 09/14/23   LONG TERM GOALS: Target date: 10/11/2023  Independent with final HEP Goal status: INITIAL  2.  PSFS score improved by 3 points Goal status: INITIAL  3.  Cervical AROM WNL without pain for improved function and ability to perform activiites Goal status: INIITAL  4.  Report pain < 3/10 with daily work activities for improved function Goal status: INITIAL    PLAN:  PT FREQUENCY: 1x/week  PT DURATION: 6 weeks  PLANNED INTERVENTIONS: 97164- PT Re-evaluation, 97110-Therapeutic exercises, 97530- Therapeutic activity, 97112- Neuromuscular re-education, 97535- Self Care, 16109- Manual therapy, [phone]- Aquatic Therapy, [id number]- Electrical stimulation (unattended), Patient/Family education, Taping, Dry Needling, Joint mobilization, Spinal manipulation, Spinal mobilization, Cryotherapy, and Moist heat.  PLAN FOR NEXT SESSION: did he go to gym,  progress as tolerated; band exercises; assess response to DN, repeat PRN   NEXT MD VISIT: PRN   Henryk Ursin F Markeshia Giebel, PT, DPT 09/20/23 8:42 AM

## 2023-09-27 ENCOUNTER — Encounter: Admitting: Physical Therapy

## 2023-10-04 ENCOUNTER — Encounter: Admitting: Physical Therapy

## 2023-10-13 ENCOUNTER — Ambulatory Visit (INDEPENDENT_AMBULATORY_CARE_PROVIDER_SITE_OTHER): Admitting: Physical Therapy

## 2023-10-13 ENCOUNTER — Encounter: Payer: Self-pay | Admitting: Physical Therapy

## 2023-10-13 DIAGNOSIS — M542 Cervicalgia: Secondary | ICD-10-CM | POA: Diagnosis not present

## 2023-10-13 DIAGNOSIS — M25512 Pain in left shoulder: Secondary | ICD-10-CM | POA: Diagnosis not present

## 2023-10-13 DIAGNOSIS — R293 Abnormal posture: Secondary | ICD-10-CM | POA: Diagnosis not present

## 2023-10-13 DIAGNOSIS — R29898 Other symptoms and signs involving the musculoskeletal system: Secondary | ICD-10-CM

## 2023-10-13 NOTE — Therapy (Addendum)
 OUTPATIENT PHYSICAL THERAPY TREATMENT RECERTIFICATION DISCHARGE SUMMARY   Patient Name: Anthony Montgomery MRN: [id number] DOB:April 13, 2001, 23 y.o., male Today's Date: 10/13/2023  END OF SESSION:  PT End of Session - 10/13/23 1348     Visit Number 4    Number of Visits 8    Date for PT Re-Evaluation 11/10/23    Authorization Type BCBS 50% coninsurance - 30 PT/OT    PT Start Time 1346    PT Stop Time 1411    PT Time Calculation (min) 25 min    Activity Tolerance Patient tolerated treatment well    Behavior During Therapy WFL for tasks assessed/performed                Past Medical History:  Diagnosis Date   Arthritis    Hypermobile joints    History reviewed. No pertinent surgical history. Patient Active Problem List   Diagnosis Date Noted   Neck strain, initial encounter 08/17/2023   Nasal bones, closed fracture 03/12/2023    PCP: Pcp, No  REFERRING PROVIDER: Jerri Kay HERO, MD  REFERRING DIAG: S16.1XXA (ICD-10-CM) - Neck strain, initial encounter  Rationale for Evaluation and Treatment: Rehabilitation  THERAPY DIAG:  Cervicalgia - Plan: PT plan of care cert/re-cert  Abnormal posture - Plan: PT plan of care cert/re-cert  Other symptoms and signs involving the musculoskeletal system - Plan: PT plan of care cert/re-cert  Acute pain of left shoulder - Plan: PT plan of care cert/re-cert  ONSET DATE: 03/06/23   SUBJECTIVE:                                                                                                                                                                                           SUBJECTIVE STATEMENT: Feels less tight but still having deeper pain   PERTINENT HISTORY:  Hand fractures concurrent, Lt talus fracture, Lt clavicle fracture.   PAIN:  Are you having pain? Yes: NPRS scale: 4 currently, up to 6, at best 2/10 Pain location: posterior neck, Lt side tightness; occasional shooting pains to collar bones Pain description:  like a jammed finger dull, ache, occasional stabbing Aggravating factors: looking down, wearing anything on head that has weight (helmet) Relieving factors: flexion and rotation stretch  PRECAUTIONS:  None  RED FLAGS: None   WEIGHT BEARING RESTRICTIONS:  No  FALLS:  Has patient fallen in last 6 months? 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   PLOF:  Independent and Leisure: running, fishing,  hiking  PATIENT GOALS:  Improve pain in neck   OBJECTIVE:  Note: Objective measures were completed at Evaluation unless otherwise noted.  DIAGNOSTIC FINDINGS:  CT scans of the neck have been negative  PATIENT SURVEYS:  Patient-Specific Activity Scoring Scheme  0 represents "unable to perform." 10 represents "able to perform at prior level. 0 1 2 3 4 5 6 7 8 9  10 (Date and Score)   Activity Eval  10/13/23   1. Lie comfortably 5  8  2. Wearing a helmet 1  4  3. Sitting at a desk 6 8  Score 4 6.67   Total score = sum of the activity scores/number of activities Minimum detectable change (90%CI) for average score = 2 points Minimum detectable change (90%CI) for single activity score = 3 points    COGNITIVE STATUS: Within functional limits for tasks assessed   SENSATION: C/o occasional numbness and tingling to lower cervical spine, no radiating symptoms  POSTURE:  rounded shoulders, forward head, and increased thoracic kyphosis  HAND DOMINANCE:  Right  GAIT: 08/30/23 Comments: mild gait deviations due to prior foot injury; no signs of imbalance   PALPATION: 08/30/23 tightness and trigger points in bil cervical paraspinals (Lt worse than Rt) and into suboccipitals; pain with CPA Gr2 mobs C4-7   CERVICAL ROM:   ROM AROM eval AROM 10/13/23  Flexion 34 (pain) 44 (with pain)  Extension 38    Right lateral flexion 35 (pain) 42  Left lateral flexion 37 (pain) 40  Right rotation 65 74  Left rotation 55 73   (Blank rows = not tested)   UPPER EXTREMITY ROM: 08/30/23: Gross screen demonstrated WFL ROM bil UEs   SPECIAL TESTS:  08/30/23 Cervical Spurling's test: negative; decreased symptoms with pressure   TREATMENT:                                                                                                                              DATE:  10/13/23 TherEx UBE L4 x 8 min (4 min each direction) ROM measurements - see above for details  Manual STM with compression to bil cervical paraspinals and suboccipitals; Gr 2 C4-7 CPA mobs to tolerance; skilled palpation and monitoring of soft tissue during DN  Trigger Point Dry Needling  Subsequent Treatment: Instructions provided previously at initial dry needling treatment.   Patient Verbal Consent Given: Yes Education Handout Provided: Previously Provided Muscles Treated: Lt cervical paraspinals, C5-6 multifidi on Lt Electrical Stimulation Performed: No Treatment Response/Outcome: twitch responses with decreased pain   09/20/23 TherEx UBE L4 x 8 min (4 min each direction) Rows L4 band 2x10; 5 sec hold Shoulder ext L4 band 2x10; 5 sec hold BATCA lat pull downs 45# 2x10 BATCA rows 45# 2x10 Counter push ups 2x10  Manual STM with compression to bil cervical paraspinals and suboccipitals; Gr 2 C6-7 CPA mobs to tolerance; skilled palpation and monitoring of soft tissue during DN  Trigger Point Dry Needling  Subsequent Treatment: Instructions provided previously at initial dry needling treatment.   Patient Verbal Consent Given: Yes Education Handout Provided: Previously Provided Muscles Treated: bil suboccipitals, C6 multifidi bil, bil upper cervical paraspinals and oblique capitus Electrical Stimulation Performed: No Treatment Response/Outcome: twitch responses with decreased pain     09/14/23 TherEx UBE L4 x 8  min (4 min each direction) Seated cervical retraction 10 x 5 sec hold Seated scapular retraction 10 x 5 sec hold Seated levator stretch x 30 sec bil Seated upper trap stretch x 30 sec bil Seated scalene stretch x 30 sec bil  Manual STM with compression to bil cervical paraspinals; Gr 2 C6-7 CPA mobs to tolerance; skilled palpation and monitoring of soft tissue during DN  Trigger Point Dry Needling  Initial Treatment: Pt instructed on Dry Needling rational, procedures, and possible side effects. Pt instructed to expect mild to moderate muscle soreness later in the day and/or into the next day.  Pt instructed in methods to reduce muscle soreness. Pt instructed to continue prescribed HEP. Patient was educated on signs and symptoms of infection and other risk factors and advised to seek medical attention should they occur.  Patient verbalized understanding of these instructions and education.   Patient Verbal Consent Given: Yes Education Handout Provided: Yes Muscles Treated: C6 multifidi, bil cervical paraspinals Electrical Stimulation Performed: No Treatment Response/Outcome: twitch responses; decreased tightness and increased subjective ROM (not formally measured)     08/30/23 TherEx See HEP - demonstrated with trial reps performed PRN, min cues for comprehension    Self Care Educated on POC and clinical findings     PATIENT EDUCATION:  Education details: HEP Person educated: Patient Education method: Programmer, multimedia, Facilities manager, and Handouts Education comprehension: verbalized understanding, returned demonstration, and needs further education  HOME EXERCISE PROGRAM: Access Code: [email or web address] URL: [email or web address] Date: 08/30/2023 Prepared by: Corean Ku  Exercises - Supine Chin Tuck  - 2 x daily - 7 x weekly - 1 sets - 10 reps - 5 sec hold - Standing Scapular Retraction  - 2 x daily - 7 x weekly - 1 sets - 10 reps - 5 sec hold - Seated Levator  Scapulae Stretch  - 2 x daily - 7 x weekly - 1 sets - 1 reps - 30 sec hold - Seated Upper Trapezius Stretch  - 2 x daily - 7 x weekly - 1 sets - 3 reps - 30 sec hold - Seated Scalenes Stretch  - 2 x daily - 7 x weekly - 1 sets - 3 reps - 30 sec hold   ASSESSMENT:  CLINICAL IMPRESSION: Pt at this time has demonstrated some functional progress towards goals but pain continues to be present with all activities.  At this time will hold PT until after he sees MD.  Will see up to 1x/wk if needed.    OBJECTIVE IMPAIRMENTS: decreased ROM, increased fascial restrictions, increased muscle spasms, impaired UE functional use, postural dysfunction, and pain.   ACTIVITY LIMITATIONS: carrying, lifting, reach over head, hygiene/grooming, and locomotion level  PARTICIPATION LIMITATIONS: meal prep, cleaning, laundry, driving, shopping, community activity, occupation, and yard work  PERSONAL FACTORS: 3+ comorbidities: Hand fractures concurrent, Lt talus fracture, Lt clavicle fracture.  are also affecting patient's functional outcome.   REHAB POTENTIAL: Good  CLINICAL DECISION MAKING: Stable/uncomplicated  EVALUATION COMPLEXITY: Low   GOALS: Goals reviewed with patient? Yes  SHORT TERM GOALS: Target date: 09/20/2023  Independent with initial HEP Goal status: MET 09/14/23   LONG TERM GOALS: Target date: 11/10/2023  Independent with final HEP Goal status: ONGOING 10/13/23  2.  PSFS score improved by 3 points Goal status: ONGOING 10/13/23  3.  Cervical AROM WNL without pain for improved function and ability to perform activiites Goal status:  ONGOING 10/13/23  4.  Report pain < 3/10 with daily work activities for improved function Goal status: ONGOING 10/13/23    PLAN:  PT FREQUENCY: 1x/week  PT DURATION: 6 weeks  PLANNED INTERVENTIONS: 97164- PT Re-evaluation, 97110-Therapeutic exercises, 97530- Therapeutic activity, 97112- Neuromuscular re-education, 97535- Self Care, [id number]- Manual  therapy, [phone]- Aquatic Therapy, [id number]- Electrical stimulation (unattended), Patient/Family education, Taping, Dry Needling, Joint mobilization, Spinal manipulation, Spinal mobilization, Cryotherapy, and Moist heat.  PLAN FOR NEXT SESSION: hold PT   NEXT MD VISIT: PRN   Corean JULIANNA Ku, PT, DPT 10/13/23 2:18 PM     PHYSICAL THERAPY DISCHARGE SUMMARY  Visits from Start of Care: 4  Current functional level related to goals / functional outcomes: See above   Remaining deficits: See above   Education / Equipment: HEP   Patient agrees to discharge. Patient goals were not met. Patient is being discharged due to maximized rehab potential.    Corean JULIANNA Ku, PT, DPT 01/12/24 12:13 PM  [facility] [address] [place], [place], [address] Phone: [phone]   Fax:  [phone]

## 2023-10-27 ENCOUNTER — Ambulatory Visit (INDEPENDENT_AMBULATORY_CARE_PROVIDER_SITE_OTHER): Admitting: Orthopaedic Surgery

## 2023-10-27 ENCOUNTER — Encounter: Payer: Self-pay | Admitting: Orthopaedic Surgery

## 2023-10-27 DIAGNOSIS — S4992XA Unspecified injury of left shoulder and upper arm, initial encounter: Secondary | ICD-10-CM

## 2023-10-27 DIAGNOSIS — S161XXA Strain of muscle, fascia and tendon at neck level, initial encounter: Secondary | ICD-10-CM | POA: Diagnosis not present

## 2023-10-27 NOTE — Progress Notes (Signed)
 Office Visit Note   Patient: Anthony Montgomery           Date of Birth: 01-Dec-2000           MRN: [id number] Visit Date: 10/27/2023              Requested by: No referring provider defined for this encounter. PCP: Pcp, No   Assessment & Plan: Visit Diagnoses:  1. Neck strain, initial encounter   2. Injury of left clavicle, initial encounter     Plan: History of Present Illness Anthony Montgomery is a 23 year old male who presents with persistent neck and spine pain.  He experiences persistent neck and spine pain, described as a constant dull ache with occasional sharpness during certain neck movements. The pain worsens when looking down and is continuous. Physical therapy has alleviated muscle tightness but not the spine pain.  He occasionally experiences tingling in his arm, though it is not severe. No other significant symptoms are present in his arms or hands.  He is unable to wear a bulletproof vest due to collarbone pain when weight is applied, affecting his ability to perform certain duties. Ongoing ankle pain and nerve pain in his leg from previous puncture wounds also impact his duties. He is seeking documentation for a potential medical discharge from the [employer].  Exam of cervical spine is unchanged from prior visit.  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 - Order MRI of the cervical spine.  Left clavicle pain Chronic collarbone pain affecting duty performance.  Ankle pain Chronic ankle pain, details not discussed.  Leg pain from puncture wounds Chronic leg pain from previous puncture wounds, details not discussed.  Follow-up Discussed need for documentation for medical discharge. 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 - Complete and return forms once received.  Total face to face encounter time was greater than 25  minutes and over half of this time was spent in counseling and/or coordination of care.  Follow-Up Instructions: No follow-ups on file.   Orders:  Orders Placed This Encounter  Procedures   MR Cervical Spine w/o contrast   No orders of the defined types were placed in this encounter.    Subjective: Chief Complaint  Patient presents with   Neck - Follow-up    HPI  Review of Systems  Constitutional: Negative.   HENT: Negative.    Eyes: Negative.   Respiratory: Negative.    Cardiovascular: Negative.   Gastrointestinal: Negative.   Endocrine: Negative.   Genitourinary: Negative.   Skin: Negative.   Allergic/Immunologic: Negative.   Neurological: Negative.   Hematological: Negative.   Psychiatric/Behavioral: Negative.    All other systems reviewed and are negative.    Objective: Vital Signs: There were no vitals taken for this visit.  Physical Exam Vitals and nursing note reviewed.  Constitutional:      Appearance: He is well-developed.  Pulmonary:     Effort: Pulmonary effort is normal.  Abdominal:     Palpations: Abdomen is soft.  Skin:    General: Skin is warm.  Neurological:     Mental Status: He is alert and oriented to person, place, and time.  Psychiatric:        Behavior: Behavior normal.        Thought Content: Thought content normal.        Judgment: Judgment normal.  PMFS History: Patient Active Problem List   Diagnosis Date Noted   Neck strain, initial encounter 08/17/2023   Nasal bones, closed fracture 03/12/2023   Past Medical History:  Diagnosis Date   Arthritis    Hypermobile joints     Family History  Problem Relation Age of Onset   Healthy Mother    Healthy Father     History reviewed. No pertinent surgical history. Social History   Occupational History   Not on file  Tobacco Use   Smoking status: Never   Smokeless tobacco: Never  Vaping Use   Vaping status: Never Used  Substance and Sexual Activity   Alcohol use:  Yes    Comment: ocassionally   Drug use: Never   Sexual activity: Not on file

## 2023-11-01 ENCOUNTER — Other Ambulatory Visit

## 2023-11-01 ENCOUNTER — Encounter: Payer: Self-pay | Admitting: Orthopaedic Surgery

## 2023-11-05 ENCOUNTER — Ambulatory Visit
Admission: RE | Admit: 2023-11-05 | Discharge: 2023-11-05 | Disposition: A | Discharge status: Home / Self Care | Source: Ambulatory Visit | Attending: Orthopaedic Surgery | Admitting: Orthopaedic Surgery

## 2023-11-05 DIAGNOSIS — S161XXA Strain of muscle, fascia and tendon at neck level, initial encounter: Secondary | ICD-10-CM

## 2023-11-30 ENCOUNTER — Ambulatory Visit (INDEPENDENT_AMBULATORY_CARE_PROVIDER_SITE_OTHER): Admitting: Orthopaedic Surgery

## 2023-11-30 ENCOUNTER — Encounter: Payer: Self-pay | Admitting: Orthopaedic Surgery

## 2023-11-30 DIAGNOSIS — S161XXA Strain of muscle, fascia and tendon at neck level, initial encounter: Secondary | ICD-10-CM | POA: Diagnosis not present
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
## 2024-03-27 ENCOUNTER — Encounter: Payer: Self-pay | Admitting: Radiology

## 2024-04-04 ENCOUNTER — Other Ambulatory Visit: Payer: Self-pay | Admitting: Physician Assistant
